# Patient Record
Sex: Female | Born: 1955 | Race: White | Hispanic: No | Marital: Married | Smoking: Current some day smoker
Health system: Southern US, Community
[De-identification: ages and names within clinical notes are randomized; demographics above are authoritative.]

## PROBLEM LIST (undated history)

## (undated) DIAGNOSIS — E785 Hyperlipidemia, unspecified: Secondary | ICD-10-CM

## (undated) DIAGNOSIS — C801 Malignant (primary) neoplasm, unspecified: Secondary | ICD-10-CM

## (undated) DIAGNOSIS — I471 Supraventricular tachycardia: Secondary | ICD-10-CM

## (undated) DIAGNOSIS — I10 Essential (primary) hypertension: Secondary | ICD-10-CM

## (undated) DIAGNOSIS — I2510 Atherosclerotic heart disease of native coronary artery without angina pectoris: Secondary | ICD-10-CM

## (undated) HISTORY — DX: Atherosclerotic heart disease of native coronary artery without angina pectoris: I25.10

## (undated) HISTORY — DX: Supraventricular tachycardia: I47.1

## (undated) HISTORY — DX: Hyperlipidemia, unspecified: E78.5

## (undated) HISTORY — PX: TONSILLECTOMY: SUR1361

## (undated) HISTORY — PX: TUBAL LIGATION: SHX77

---

## 2004-11-23 ENCOUNTER — Emergency Department: Payer: Self-pay | Admitting: Emergency Medicine

## 2011-07-31 ENCOUNTER — Emergency Department: Payer: Self-pay | Admitting: *Deleted

## 2011-07-31 LAB — BASIC METABOLIC PANEL
Calcium, Total: 8.9 mg/dL (ref 8.5–10.1)
Co2: 31 mmol/L (ref 21–32)
EGFR (African American): >60
EGFR (Non-African Amer.): >60
Glucose: 126 mg/dL — ABNORMAL HIGH (ref 65–99)
Osmolality: 275 (ref 275–301)
Sodium: 137 mmol/L (ref 136–145)

## 2011-07-31 LAB — CBC
HGB: 13.9 g/dL (ref 12.0–16.0)
MCH: 32.8 pg (ref 26.0–34.0)
Platelet: 136 10*3/uL — ABNORMAL LOW (ref 150–440)
RBC: 4.23 10*6/uL (ref 3.80–5.20)
RDW: 12.8 % (ref 11.5–14.5)
WBC: 8.6 10*3/uL (ref 3.6–11.0)

## 2011-07-31 LAB — URINALYSIS, COMPLETE
Bacteria: NONE SEEN
Blood: NEGATIVE
Ketone: NEGATIVE
Leukocyte Esterase: NEGATIVE
Nitrite: NEGATIVE
Ph: 7 (ref 4.5–8.0)
Protein: NEGATIVE
Specific Gravity: 1.023 (ref 1.003–1.030)
WBC UR: 2 /HPF (ref 0–5)

## 2011-08-01 LAB — HEPATIC FUNCTION PANEL A (ARMC)
Alkaline Phosphatase: 62 U/L (ref 50–136)
SGOT(AST): 32 U/L (ref 15–37)
SGPT (ALT): 20 U/L
Total Protein: 7.2 g/dL (ref 6.4–8.2)

## 2011-08-02 ENCOUNTER — Emergency Department: Payer: Self-pay | Admitting: Emergency Medicine

## 2011-08-02 LAB — CBC
HCT: 41.8 % (ref 35.0–47.0)
MCH: 32.9 pg (ref 26.0–34.0)
MCHC: 34.1 g/dL (ref 32.0–36.0)
MCV: 96 fL (ref 80–100)
Platelet: 137 10*3/uL — ABNORMAL LOW (ref 150–440)
RDW: 12.8 % (ref 11.5–14.5)

## 2011-08-02 LAB — COMPREHENSIVE METABOLIC PANEL
Albumin: 4.3 g/dL (ref 3.4–5.0)
Alkaline Phosphatase: 61 U/L (ref 50–136)
BUN: 15 mg/dL (ref 7–18)
Calcium, Total: 8.9 mg/dL (ref 8.5–10.1)
Creatinine: 0.89 mg/dL (ref 0.60–1.30)
Glucose: 104 mg/dL — ABNORMAL HIGH (ref 65–99)
Potassium: 3.6 mmol/L (ref 3.5–5.1)
SGOT(AST): 27 U/L (ref 15–37)
SGPT (ALT): 20 U/L
Total Protein: 7.9 g/dL (ref 6.4–8.2)

## 2011-08-02 LAB — URINALYSIS, COMPLETE
Bilirubin,UR: NEGATIVE
Blood: NEGATIVE
Leukocyte Esterase: NEGATIVE
Nitrite: NEGATIVE
Ph: 6 (ref 4.5–8.0)
Protein: NEGATIVE
RBC,UR: 2 /HPF (ref 0–5)
Specific Gravity: 1.019 (ref 1.003–1.030)
Squamous Epithelial: NONE SEEN

## 2011-08-02 LAB — URINE CULTURE

## 2011-12-02 ENCOUNTER — Ambulatory Visit: Payer: Self-pay | Admitting: Internal Medicine

## 2012-11-18 ENCOUNTER — Emergency Department: Payer: Self-pay | Admitting: Emergency Medicine

## 2012-11-18 LAB — TROPONIN I: Troponin-I: 0.67 ng/mL — ABNORMAL HIGH

## 2012-11-18 LAB — CBC
HCT: 41.4 % (ref 35.0–47.0)
HGB: 13.9 g/dL (ref 12.0–16.0)
MCV: 95 fL (ref 80–100)

## 2012-11-18 LAB — COMPREHENSIVE METABOLIC PANEL
Albumin: 3.5 g/dL (ref 3.4–5.0)
Alkaline Phosphatase: 118 U/L (ref 50–136)
Chloride: 103 mmol/L (ref 98–107)
Glucose: 110 mg/dL — ABNORMAL HIGH (ref 65–99)
Osmolality: 277 (ref 275–301)
SGOT(AST): 280 U/L — ABNORMAL HIGH (ref 15–37)
Total Protein: 6.6 g/dL (ref 6.4–8.2)

## 2012-11-18 LAB — CK TOTAL AND CKMB (NOT AT ARMC): CK-MB: 3.2 ng/mL (ref 0.5–3.6)

## 2012-12-25 DIAGNOSIS — Z0189 Encounter for other specified special examinations: Secondary | ICD-10-CM | POA: Insufficient documentation

## 2012-12-28 DIAGNOSIS — Z8249 Family history of ischemic heart disease and other diseases of the circulatory system: Secondary | ICD-10-CM | POA: Insufficient documentation

## 2012-12-28 DIAGNOSIS — R002 Palpitations: Secondary | ICD-10-CM | POA: Insufficient documentation

## 2012-12-28 DIAGNOSIS — G8929 Other chronic pain: Secondary | ICD-10-CM | POA: Insufficient documentation

## 2012-12-28 DIAGNOSIS — Z9119 Patient's noncompliance with other medical treatment and regimen: Secondary | ICD-10-CM | POA: Insufficient documentation

## 2013-06-17 HISTORY — PX: CARDIAC CATHETERIZATION: SHX172

## 2013-07-03 ENCOUNTER — Inpatient Hospital Stay: Payer: Self-pay | Admitting: Internal Medicine

## 2013-07-03 LAB — COMPREHENSIVE METABOLIC PANEL
ALBUMIN: 3.6 g/dL (ref 3.4–5.0)
ALK PHOS: 80 U/L
AST: 54 U/L — AB (ref 15–37)
Anion Gap: 5 — ABNORMAL LOW (ref 7–16)
BUN: 17 mg/dL (ref 7–18)
Bilirubin,Total: 0.3 mg/dL (ref 0.2–1.0)
CHLORIDE: 106 mmol/L (ref 98–107)
CO2: 28 mmol/L (ref 21–32)
Calcium, Total: 9 mg/dL (ref 8.5–10.1)
Creatinine: 1.1 mg/dL (ref 0.60–1.30)
EGFR (Non-African Amer.): 56 — ABNORMAL LOW
GLUCOSE: 88 mg/dL (ref 65–99)
Osmolality: 279 (ref 275–301)
POTASSIUM: 4.3 mmol/L (ref 3.5–5.1)
SGPT (ALT): 42 U/L (ref 12–78)
Sodium: 139 mmol/L (ref 136–145)
Total Protein: 6.8 g/dL (ref 6.4–8.2)

## 2013-07-03 LAB — CK TOTAL AND CKMB (NOT AT ARMC)
CK, Total: 85 U/L (ref 21–215)
CK-MB: 2.4 ng/mL (ref 0.5–3.6)

## 2013-07-03 LAB — CBC
HCT: 40.5 % (ref 35.0–47.0)
HGB: 13.7 g/dL (ref 12.0–16.0)
MCH: 31.7 pg (ref 26.0–34.0)
MCHC: 33.9 g/dL (ref 32.0–36.0)
MCV: 94 fL (ref 80–100)
Platelet: 176 10*3/uL (ref 150–440)
RBC: 4.33 10*6/uL (ref 3.80–5.20)
RDW: 12.8 % (ref 11.5–14.5)
WBC: 9.6 10*3/uL (ref 3.6–11.0)

## 2013-07-03 LAB — TROPONIN I: TROPONIN-I: 0.17 ng/mL — AB

## 2013-07-04 DIAGNOSIS — I214 Non-ST elevation (NSTEMI) myocardial infarction: Secondary | ICD-10-CM

## 2013-07-04 DIAGNOSIS — I471 Supraventricular tachycardia: Secondary | ICD-10-CM

## 2013-07-04 LAB — CK TOTAL AND CKMB (NOT AT ARMC)
CK, TOTAL: 79 U/L (ref 21–215)
CK, Total: 80 U/L (ref 21–215)
CK-MB: 5.5 ng/mL — ABNORMAL HIGH (ref 0.5–3.6)
CK-MB: 5.3 ng/mL — AB (ref 0.5–3.6)

## 2013-07-04 LAB — CBC WITH DIFFERENTIAL/PLATELET
BASOS ABS: 0.1 10*3/uL (ref 0.0–0.1)
BASOS PCT: 0.8 %
EOS PCT: 1.3 %
Eosinophil #: 0.1 10*3/uL (ref 0.0–0.7)
HCT: 37.4 % (ref 35.0–47.0)
HGB: 12.7 g/dL (ref 12.0–16.0)
LYMPHS ABS: 2.3 10*3/uL (ref 1.0–3.6)
LYMPHS PCT: 32.6 %
MCH: 31.6 pg (ref 26.0–34.0)
MCHC: 33.8 g/dL (ref 32.0–36.0)
MCV: 93 fL (ref 80–100)
MONOS PCT: 8.8 %
Monocyte #: 0.6 x10 3/mm (ref 0.2–0.9)
Neutrophil #: 4 10*3/uL (ref 1.4–6.5)
Neutrophil %: 56.5 %
Platelet: 164 10*3/uL (ref 150–440)
RBC: 4.01 10*6/uL (ref 3.80–5.20)
RDW: 13 % (ref 11.5–14.5)
WBC: 7 10*3/uL (ref 3.6–11.0)

## 2013-07-04 LAB — TSH: Thyroid Stimulating Horm: 2.64 u[IU]/mL

## 2013-07-04 LAB — BASIC METABOLIC PANEL
ANION GAP: 5 — AB (ref 7–16)
BUN: 14 mg/dL (ref 7–18)
CALCIUM: 8.6 mg/dL (ref 8.5–10.1)
CHLORIDE: 109 mmol/L — AB (ref 98–107)
CO2: 25 mmol/L (ref 21–32)
Creatinine: 0.87 mg/dL (ref 0.60–1.30)
EGFR (Non-African Amer.): >60
Glucose: 101 mg/dL — ABNORMAL HIGH (ref 65–99)
OSMOLALITY: 278 (ref 275–301)
Potassium: 3.8 mmol/L (ref 3.5–5.1)
SODIUM: 139 mmol/L (ref 136–145)

## 2013-07-04 LAB — TROPONIN I
Troponin-I: 0.91 ng/mL — ABNORMAL HIGH
Troponin-I: 0.81 ng/mL — ABNORMAL HIGH
Troponin-I: 0.66 ng/mL — ABNORMAL HIGH

## 2013-07-04 LAB — CK-MB: CK-MB: 4.8 ng/mL — ABNORMAL HIGH (ref 0.5–3.6)

## 2013-07-05 ENCOUNTER — Telehealth: Payer: Self-pay

## 2013-07-05 DIAGNOSIS — I2510 Atherosclerotic heart disease of native coronary artery without angina pectoris: Secondary | ICD-10-CM

## 2013-07-05 LAB — PROTIME-INR
INR: 1
Prothrombin Time: 12.9 secs (ref 11.5–14.7)

## 2013-07-05 NOTE — Telephone Encounter (Signed)
Patient contacted regarding discharge from [facility] on 07/05/13.  Patient understands to follow up with Dr. Fletcher Anon on 07/19/13 at 2:00 at [facility]. Patient understands discharge instructions? yes Patient understands medications and regiment? yes Patient understands to bring all medications to this visit? yes

## 2013-07-06 ENCOUNTER — Encounter: Payer: Self-pay | Admitting: Cardiovascular Disease

## 2013-07-08 ENCOUNTER — Telehealth: Payer: Self-pay

## 2013-07-08 NOTE — Telephone Encounter (Signed)
Request from [place] , sent to [place] on 07/08/2013.

## 2013-07-14 ENCOUNTER — Encounter: Payer: Self-pay | Admitting: *Deleted

## 2013-07-19 ENCOUNTER — Ambulatory Visit (INDEPENDENT_AMBULATORY_CARE_PROVIDER_SITE_OTHER): Payer: 59 | Admitting: Cardiovascular Disease

## 2013-07-19 ENCOUNTER — Encounter: Payer: Self-pay | Admitting: Cardiovascular Disease

## 2013-07-19 VITALS — BP 100/62 | HR 66 | Ht 65.0 in | Wt 171.0 lb

## 2013-07-19 DIAGNOSIS — I471 Supraventricular tachycardia: Secondary | ICD-10-CM

## 2013-07-19 DIAGNOSIS — I2510 Atherosclerotic heart disease of native coronary artery without angina pectoris: Secondary | ICD-10-CM | POA: Insufficient documentation

## 2013-07-19 DIAGNOSIS — E785 Hyperlipidemia, unspecified: Secondary | ICD-10-CM | POA: Insufficient documentation

## 2013-07-19 MED ORDER — DILTIAZEM HCL ER 240 MG PO CP24: 240.0000 mg | ORAL_CAPSULE | Freq: Every day | ORAL | Status: DC

## 2013-07-19 NOTE — Patient Instructions (Addendum)
Refer to Dr. Caryl Comes for SVT.   You can resume work without restrictions.   Stop Lisinopril. Continue other medications.   Your physician wants you to follow-up in: 6 months.  You will receive a reminder letter in the mail two months in advance. If you don't receive a letter, please call our office to schedule the follow-up appointment.

## 2013-07-19 NOTE — Assessment & Plan Note (Signed)
Continue small dose atorvastatin. I recommend not to increase the dose given that she is on a calcium channel blocker.

## 2013-07-19 NOTE — Progress Notes (Signed)
Primary care physician: Dr. Eliberto Ivory  HPI  This is a pleasant 58 year old female who is here today for a followup visit after recent hospitalization at [facility] for supraventricular tachycardia. She has prolonged history of paroxysmal supraventricular tachycardia and had 5 episodes in the last few years which required either emergency room visits or hospitalizations. She has been treated with metoprolol 50 mg twice daily and was being followed by Dr. Saralyn Pilar. She requested transfer to our practice during her recent hospitalization. She presented on January 17 with palpitations and tachycardia. This was associated with prolonged substernal chest tightness. She converted to normal sinus rhythm in route to the ED. She was found to have an elevated troponin at 0.91. 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 I proceeded with cardiac catheterization which showed mild nonobstructive three-vessel coronary artery disease. There was significant spasm noted in the mid LAD which resolved with intracoronary nitroglycerin. Ejection fraction was normal. I switched her from metoprolol to diltiazem. She has been doing reasonably well since hospital discharge with no recurrent tachycardia. She does feel pounding sensation in her chest.  Allergies  Allergen Reactions  . Codeine     Other reaction(s): Pruritic rash  . Penicillin G     Other reaction(s): Pruritic rash  . Sulfa Antibiotics     Other reaction(s): Pruritic rash     Current Outpatient Prescriptions on File Prior to Visit  Medication Sig Dispense Refill  . albuterol (PROVENTIL HFA;VENTOLIN HFA) 108 (90 BASE) MCG/ACT inhaler Inhale 2 puffs into the lungs every 4 (four) hours as needed for wheezing or shortness of breath.      Marland Kitchen aspirin 81 MG tablet Take 81 mg by mouth daily.      Marland Kitchen atorvastatin (LIPITOR) 10 MG tablet Take 10 mg by mouth daily.       No current  facility-administered medications on file prior to visit.     Past Medical History  Diagnosis Date  . PSVT (paroxysmal supraventricular tachycardia)   . Coronary artery disease     Cardiac catheterization in January of 2015 showed mild nonobstructive three-vessel coronary artery disease with significant mid LAD spasm which improved with nitroglycerin. Metoprolol was switched to diltiazem  . Hyperlipidemia      Past Surgical History  Procedure Laterality Date  . Tonsillectomy    . Tubal ligation    . Cardiac catheterization  06/2013    [facility]     Family History  Problem Relation Age of Onset  . Stroke Mother   . Heart attack Father      History   Social History  . Marital Status: Divorced    Spouse Name: N/A    Number of Children: N/A  . Years of Education: N/A   Occupational History  . Not on file.   Social History Main Topics  . Smoking status: Former Smoker -- 30 years    Types: Cigarettes  . Smokeless tobacco: Not on file  . Alcohol Use: No  . Drug Use: No  . Sexual Activity: Not on file   Other Topics Concern  . Not on file   Social History Narrative  . No narrative on file     ROS A 10 point review of system was performed. It is negative other than that mentioned in the history of present illness.   PHYSICAL EXAM   BP 100/62  Pulse 66  Ht 5\' 5"  (1.651 m)  Wt 171 lb (77.565 kg)  BMI 28.46 kg/m2 Constitutional: She is oriented to person, place, and time. She appears well-developed and well-nourished. No distress.  HENT: No nasal discharge.  Head: Normocephalic and atraumatic.  Eyes: Pupils are equal and round. No discharge.  Neck: Normal range of motion. Neck supple. No JVD present. No thyromegaly present.  Cardiovascular: Normal rate, regular rhythm, normal heart sounds. Exam reveals no gallop and no friction rub. No murmur heard.  Pulmonary/Chest: Effort normal and breath sounds normal. No stridor. No respiratory distress. She has no  wheezes. She has no rales. She exhibits no tenderness.  Abdominal: Soft. Bowel sounds are normal. She exhibits no distension. There is no tenderness. There is no rebound and no guarding.  Musculoskeletal: Normal range of motion. She exhibits no edema and no tenderness.  Neurological: She is alert and oriented to person, place, and time. Coordination normal.  Skin: Skin is warm and dry. No rash noted. She is not diaphoretic. No erythema. No pallor.  Psychiatric: She has a normal mood and affect. Her behavior is normal. Judgment and thought content normal.  Right radial pulse is normal with no hematoma   EKG: Normal sinus rhythm with sinus arrhythmia   ASSESSMENT AND PLAN

## 2013-07-19 NOTE — Assessment & Plan Note (Signed)
The patient has known history of recurrent supraventricular tachycardia in spite of treatment with metoprolol. 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 She was recently switched from metoprolol to diltiazem. However, I am still concerned about the possibility of recurrent events. I will refer her to Dr. Caryl Comes to consider catheter ablation. She can resume work from a cardiac standpoint. Given that her blood pressure is somewhat on the low side, I stopped lisinopril today. Continue current dose of diltiazem

## 2013-07-19 NOTE — Assessment & Plan Note (Signed)
Continue small dose aspirin and treatment with atorvastatin. She has no symptoms suggestive of angina.

## 2013-07-21 ENCOUNTER — Telehealth: Payer: Self-pay | Admitting: *Deleted

## 2013-07-21 ENCOUNTER — Other Ambulatory Visit: Payer: Self-pay | Admitting: *Deleted

## 2013-07-21 MED ORDER — LISINOPRIL 5 MG PO TABS: 5.0000 mg | ORAL_TABLET | Freq: Every day | ORAL | Status: DC

## 2013-07-21 NOTE — Telephone Encounter (Signed)
Informed patient that per Dr. Fletcher Anon she can restart her Lisinopril 5 mg daily. Instructed patient to monitor blood pressure and bring log to her next visit. Patient verbalized understanding.

## 2013-07-21 NOTE — Telephone Encounter (Signed)
She feels like she needs to go back on her bp medicine (lisinopril 5mg ). Patient bp is going back up with a bad headache.

## 2013-07-21 NOTE — Telephone Encounter (Signed)
That is fine. Resume Lisinopril.

## 2013-08-02 ENCOUNTER — Other Ambulatory Visit: Payer: Self-pay | Admitting: *Deleted

## 2013-08-02 ENCOUNTER — Telehealth: Payer: Self-pay | Admitting: *Deleted

## 2013-08-02 MED ORDER — LISINOPRIL 5 MG PO TABS: 5.0000 mg | ORAL_TABLET | Freq: Every day | ORAL | Status: DC

## 2013-08-02 NOTE — Telephone Encounter (Signed)
Health port info faxed 08/02/13

## 2013-08-02 NOTE — Telephone Encounter (Signed)
Requested Prescriptions   Signed Prescriptions Disp Refills  . lisinopril (PRINIVIL,ZESTRIL) 5 MG tablet 30 tablet 3    Sig: Take 1 tablet (5 mg total) by mouth daily.    Authorizing Provider: Kathlyn Sacramento A    Ordering User: Britt Bottom

## 2013-08-03 DIAGNOSIS — I2510 Atherosclerotic heart disease of native coronary artery without angina pectoris: Secondary | ICD-10-CM

## 2013-08-03 DIAGNOSIS — E785 Hyperlipidemia, unspecified: Secondary | ICD-10-CM

## 2013-08-04 ENCOUNTER — Ambulatory Visit: Payer: 59 | Admitting: Internal Medicine

## 2013-08-06 ENCOUNTER — Other Ambulatory Visit: Payer: Self-pay

## 2013-08-06 MED ORDER — LISINOPRIL 5 MG PO TABS: 5.0000 mg | ORAL_TABLET | Freq: Every day | ORAL | Status: DC

## 2013-08-06 MED ORDER — ATORVASTATIN CALCIUM 10 MG PO TABS: 10.0000 mg | ORAL_TABLET | Freq: Every day | ORAL | Status: DC

## 2013-08-09 ENCOUNTER — Other Ambulatory Visit: Payer: Self-pay

## 2013-08-09 MED ORDER — LISINOPRIL 5 MG PO TABS: 5.0000 mg | ORAL_TABLET | Freq: Every day | ORAL | Status: DC

## 2013-09-03 ENCOUNTER — Telehealth: Payer: Self-pay | Admitting: *Deleted

## 2013-09-03 NOTE — Telephone Encounter (Signed)
Faxed disability forms to [employer]

## 2013-09-03 NOTE — Telephone Encounter (Signed)
Sent Disability forms to health port [place] mail

## 2013-09-07 ENCOUNTER — Ambulatory Visit (INDEPENDENT_AMBULATORY_CARE_PROVIDER_SITE_OTHER): Payer: 59 | Admitting: Internal Medicine

## 2013-09-07 ENCOUNTER — Encounter: Payer: Self-pay | Admitting: Internal Medicine

## 2013-09-07 VITALS — BP 125/84 | HR 86 | Ht 65.0 in | Wt 172.5 lb

## 2013-09-07 DIAGNOSIS — I471 Supraventricular tachycardia: Secondary | ICD-10-CM

## 2013-09-07 NOTE — Patient Instructions (Signed)
Please see Dr. Crissie Sickles in [place] in 3 months

## 2013-09-07 NOTE — Progress Notes (Signed)
ELECTROPHYSIOLOGY CONSULT NOTE  Patient ID: Latasha [name], MRN: [id number], DOB/AGE: Nov 15, 1955 58 y.o. Admit date: (Not on file) Date of Consult: 09/07/2013  Primary Physician: Estanislado Spire, MD Primary Cardiologist: MA   Chief Complaint: SVT   HPI Latasha [name] is a 58 y.o. female  with 40 year history of recurrent abrupt onset offset tachypalpitations. They are frog positive and diuretic negative. They're frequently triggered by bending over. They're associated with presyncope but no syncope and most recently with chest discomfort. This prompted catheterization which demonstrated no significant obstructive disease. She has normal left ventricular function and a previously normal echo.  These episodes have been treated in the past with beta blockers and more recently treated with calcium blocker. There is some concern about spasm at the catheterization.  She has stopped smoking.   She has mild dyspnea on exertion but no edema. Hshe has had no syncope    Past Medical History  Diagnosis Date  . PSVT (paroxysmal supraventricular tachycardia)   . Coronary artery disease     Cardiac catheterization in January of 2015 showed mild nonobstructive three-vessel coronary artery disease with significant mid LAD spasm which improved with nitroglycerin. Metoprolol was switched to diltiazem  . Hyperlipidemia       Surgical History:  Past Surgical History  Procedure Laterality Date  . Tonsillectomy    . Tubal ligation    . Cardiac catheterization  06/2013    [facility]     Home Meds: Prior to Admission medications   Medication Sig Start Date End Date Taking? Authorizing Provider  acetaminophen (TYLENOL) 325 MG tablet Take 650 mg by mouth every 6 (six) hours as needed.   Yes Historical Provider, MD  albuterol (PROVENTIL HFA;VENTOLIN HFA) 108 (90 BASE) MCG/ACT inhaler Inhale 2 puffs into the lungs every 4 (four) hours as needed for wheezing or shortness of breath.   Yes Historical Provider, MD    aspirin 81 MG tablet Take 81 mg by mouth daily.   Yes Historical Provider, MD  atorvastatin (LIPITOR) 10 MG tablet Take 1 tablet (10 mg total) by mouth daily. 08/06/13  Yes Minna Merritts, MD  cetirizine (ZYRTEC) 10 MG tablet Take 10 mg by mouth daily.   Yes Historical Provider, MD  diltiazem (DILACOR XR) 240 MG 24 hr capsule Take 1 capsule (240 mg total) by mouth daily. 07/19/13  Yes Wellington Hampshire, MD  lisinopril (PRINIVIL,ZESTRIL) 5 MG tablet Take 1 tablet (5 mg total) by mouth daily. 08/09/13  Yes Wellington Hampshire, MD      Allergies  Allergen Reactions  . Codeine     Other reaction(s): Pruritic rash  . Penicillin G     Other reaction(s): Pruritic rash  . Sulfa Antibiotics     Other reaction(s): Pruritic rash    History   Social History  . Marital Status: Divorced    Spouse Name: N/A    Number of Children: N/A  . Years of Education: N/A   Occupational History  . Not on file.   Social History Main Topics  . Smoking status: Former Smoker -- 30 years    Types: Cigarettes  . Smokeless tobacco: Not on file  . Alcohol Use: No  . Drug Use: No  . Sexual Activity: Not on file   Other Topics Concern  . Not on file   Social History Narrative  . No narrative on file     Family History  Problem Relation Age of Onset  . Stroke Mother   .  Heart attack Father      ROS:  Please see the history of present illness.     All other systems reviewed and negative.    Physical Exam: Blood pressure 125/84, pulse 86, height 5\' 5"  (1.651 m), weight 172 lb 8 oz (78.245 kg). General: Well developed, well nourished female in no acute distress. Head: Normocephalic, atraumatic, sclera non-icteric, no xanthomas, nares are without discharge. EENT: normal Lymph Nodes:  none Back: without scoliosis/kyphosis, no CVA tendersness Neck: Negative for carotid bruits. JVD not elevated. Lungs: Clear bilaterally to auscultation without wheezes, rales, or rhonchi. Breathing is unlabored. Heart:  RRR with S1 S2. No  murmur , rubs, or gallops appreciated. Abdomen: Soft, non-tender, non-distended with normoactive bowel sounds. No hepatomegaly. No rebound/guarding. No obvious abdominal masses. Msk:  Strength and tone appear normal for age. Extremities: No clubbing or cyanosis. No  edema.  Distal pedal pulses are 2+ and equal bilaterally. Skin: Warm and Dry Neuro: Alert and oriented X 3. CN III-XII intact Grossly normal sensory and motor function . Psych:  Responds to questions appropriately with a normal affect.      Labs: Cardiac Enzymes No results found for this basename: CKTOTAL, CKMB, TROPONINI,  in the last 72 hours CBC No results found for this basename: WBC, HGB, HCT, MCV, PLT   PROTIME: No results found for this basename: LABPROT, INR,  in the last 72 hours Chemistry No results found for this basename: NA, K, CL, CO2, BUN, CREATININE, CALCIUM, LABALBU, PROT, BILITOT, ALKPHOS, ALT, AST, GLUCOSE,  in the last 168 hours Lipids No results found for this basename: CHOL, HDL, LDLCALC, TRIG   BNP No results found for this basename: probnp   Miscellaneous No results found for this basename: DDIMER    Radiology/Studies:  No results found.  EKG:  Sinus rhythm at 86 Intervals 15/09/38 No preexcitation  ECG dated 4 June 14 demonstrates narrow QRS tachycardia at a cycle length of 320 ms with an R. Prime in lead V1 way that it was sudden there is no   Assessment and Plan:   Supraventricular tachycardia  Hypertension  The patient has recurrent  SVT that has broken through beta blockers. She is taking calcium blockers. She would like to think about catheter ablation and we discussed at length including potential benefits as well as risks including but not limited to catastrophic outcomes of less than 1000 and heart block requiring pacemaker implantation in the range of 1/100-300   she has some financial issues that she would like to square off the firstand so we'll arrange  for her to see Dr. Elliot Cousin in [place] in about 3 or 4 months to pursue the procedure.  Virl Axe

## 2013-11-16 ENCOUNTER — Institutional Professional Consult (permissible substitution): Payer: 59 | Admitting: Internal Medicine

## 2013-12-01 ENCOUNTER — Other Ambulatory Visit: Payer: Self-pay | Admitting: Cardiovascular Disease

## 2013-12-14 ENCOUNTER — Institutional Professional Consult (permissible substitution): Payer: 59 | Admitting: Internal Medicine

## 2014-01-06 ENCOUNTER — Institutional Professional Consult (permissible substitution): Payer: 59 | Admitting: Internal Medicine

## 2014-02-15 ENCOUNTER — Institutional Professional Consult (permissible substitution): Payer: 59 | Admitting: Internal Medicine

## 2014-03-22 ENCOUNTER — Encounter: Payer: Self-pay | Admitting: Internal Medicine

## 2014-03-22 ENCOUNTER — Ambulatory Visit (INDEPENDENT_AMBULATORY_CARE_PROVIDER_SITE_OTHER): Payer: 59 | Admitting: Internal Medicine

## 2014-03-22 ENCOUNTER — Encounter: Payer: Self-pay | Admitting: *Deleted

## 2014-03-22 VITALS — BP 142/86 | HR 91 | Ht 63.0 in | Wt 164.0 lb

## 2014-03-22 DIAGNOSIS — I471 Supraventricular tachycardia: Secondary | ICD-10-CM

## 2014-03-22 NOTE — Patient Instructions (Addendum)
Your physician has recommended that you have an ablation. Catheter ablation is a medical procedure used to treat some cardiac arrhythmias (irregular heartbeats). During catheter ablation, a long, thin, flexible tube is put into a blood vessel in your groin (upper thigh), or neck. This tube is called an ablation catheter. It is then guided to your heart through the blood vessel. Radio frequency waves destroy small areas of heart tissue where abnormal heartbeats may cause an arrhythmia to start. Please see the instruction sheet given to you today.  Your physician recommends that you continue on your current medications as directed. Please refer to the Current Medication list given to you today. Cardiac Ablation Cardiac ablation is a procedure to disable a small amount of heart tissue in very specific places. The heart has many electrical connections. Sometimes these connections are abnormal and can cause the heart to beat very fast or irregularly. By disabling some of the problem areas, heart rhythm can be improved or made normal. Ablation is done for people who:   Have Wolff-Parkinson-White syndrome.   Have other fast heart rhythms (tachycardia).   Have taken medicines for an abnormal heart rhythm (arrhythmia) that resulted in:   No success.   Side effects.   May have a high-risk heartbeat that could result in death.  LET [facility] CARE PROVIDER KNOW ABOUT:   Any allergies you have or any previous reactions you have had to X-ray dye, food (such as seafood), medicine, or tape.   All medicines you are taking, including vitamins, herbs, eye drops, creams, and over-the-counter medicines.   Previous problems you or members of your family have had with the use of anesthetics.   Any blood disorders you have.   Previous surgeries or procedures (such as a kidney transplant) you have had.   Medical conditions you have (such as kidney failure).  RISKS AND COMPLICATIONS Generally,  cardiac ablation is a safe procedure. However, problems can occur and include:   Increased risk of cancer. Depending on how long it takes to do the ablation, the dose of radiation can be high.  Bruising and bleeding where a thin, flexible tube (catheter) was inserted during the procedure.   Bleeding into the chest, especially into the sac that surrounds the heart (serious).  Need for a permanent pacemaker if the normal electrical system is damaged.   The procedure may not be fully effective, and this may not be recognized for months. Repeat ablation procedures are sometimes required. BEFORE THE PROCEDURE   Follow any instructions from your health care provider regarding eating and drinking before the procedure.   Take your medicines as directed at regular times with water, unless instructed otherwise by your health care provider. If you are taking diabetes medicine, including insulin, ask how you are to take it and if there are any special instructions you should follow. It is common to adjust insulin dosing the day of the ablation.  PROCEDURE  An ablation is usually performed in a catheterization laboratory with the guidance of fluoroscopy. Fluoroscopy is a type of X-ray that helps your health care provider see images of your heart during the procedure.   An ablation is a minimally invasive procedure. This means a small cut (incision) is made in either your neck or groin. Your health care provider will decide where to make the incision based on your medical history and physical exam.  An IV tube will be started before the procedure begins. You will be given an anesthetic or medicine to  help you relax (sedative).  The skin on your neck or groin will be numbed. A needle will be inserted into a large vein in your neck or groin and catheters will be threaded to your heart.  A special dye that shows up on fluoroscopy pictures may be injected through the catheter. The dye helps your health  care provider see the area of the heart that needs treatment.  The catheter has electrodes on the tip. When the area of heart tissue that is causing the arrhythmia is found, the catheter tip will send an electrical current to the area and "scar" the tissue. Three types of energy can be used to ablate the heart tissue:   Heat (radiofrequency energy).   Laser energy.   Extreme cold (cryoablation).   When the area of the heart has been ablated, the catheter will be taken out. Pressure will be held on the insertion site. This will help the insertion site clot and keep it from bleeding. A bandage will be placed on the insertion site.  AFTER THE PROCEDURE   After the procedure, you will be taken to a recovery area where your vital signs (blood pressure, heart rate, and breathing) will be monitored. The insertion site will also be monitored for bleeding.   You will need to lie still for 4-6 hours. This is to ensure you do not bleed from the catheter insertion site.  Document Released: 10/20/2008 Document Revised: 10/18/2013 Document Reviewed: 10/26/2012 [facility] Patient Information 2015 [place], [place]. This information is not intended to replace advice given to you by your health care provider. Make sure you discuss any questions you have with your health care provider.

## 2014-03-22 NOTE — Progress Notes (Signed)
HPI Latasha [name] is referred today by Dr. Caryl Comes for consideration of catheter ablation of SVT. The patient is a 58 year old woman with a long-standing history of tachycardia palpitations dating back to age 53. At that time, she had documented SVT at 200 beats per minute. Since then she has had recurrent episodes which have worsened in the last year. The episodes start and stop suddenly, and can be relieved by vagal maneuvers or Valsalva. She has not required adenosine. She has been treated with calcium channel blockers with moderate success. However she feels tired and fatigued, and would like to get off of these medications. Allergies  Allergen Reactions  . Codeine Nausea And Vomiting    Other reaction(s): Pruritic rash  . Penicillin G     Other reaction(s): Pruritic rash  . Sulfa Antibiotics Other (See Comments)    Other reaction(s): Pruritic rash  . Penicillins Rash     Current Outpatient Prescriptions  Medication Sig Dispense Refill  . acetaminophen (TYLENOL) 325 MG tablet Take 650 mg by mouth every 6 (six) hours as needed.      Marland Kitchen albuterol (PROVENTIL HFA;VENTOLIN HFA) 108 (90 BASE) MCG/ACT inhaler Inhale 2 puffs into the lungs every 4 (four) hours as needed for wheezing or shortness of breath.      Marland Kitchen aspirin 81 MG tablet Take 81 mg by mouth daily.      . cetirizine (ZYRTEC) 10 MG tablet Take 10 mg by mouth daily.      Marland Kitchen diltiazem (DILACOR XR) 240 MG 24 hr capsule Take 1 capsule (240 mg total) by mouth daily.  30 capsule  6  . lisinopril (PRINIVIL,ZESTRIL) 5 MG tablet Take 1 tablet (5 mg total) by mouth daily.  30 tablet  6   No current facility-administered medications for this visit.     Past Medical History  Diagnosis Date  . PSVT (paroxysmal supraventricular tachycardia)   . Coronary artery disease     Cardiac catheterization in January of 2015 showed mild nonobstructive three-vessel coronary artery disease with significant mid LAD spasm which improved with  nitroglycerin. Metoprolol was switched to diltiazem  . Hyperlipidemia     ROS:   All systems reviewed and negative except as noted in the HPI.   Past Surgical History  Procedure Laterality Date  . Tonsillectomy    . Tubal ligation    . Cardiac catheterization  06/2013    [facility]     Family History  Problem Relation Age of Onset  . Stroke Mother   . Heart attack Father      History   Social History  . Marital Status: Divorced    Spouse Name: N/A    Number of Children: N/A  . Years of Education: N/A   Occupational History  . Not on file.   Social History Main Topics  . Smoking status: Current Some Day Smoker -- 30 years    Types: Cigarettes  . Smokeless tobacco: Not on file  . Alcohol Use: No  . Drug Use: No  . Sexual Activity: Not on file   Other Topics Concern  . Not on file   Social History Narrative  . No narrative on file     BP 142/86  Pulse 91  Ht 5\' 3"  (1.6 m)  Wt 164 lb (74.39 kg)  BMI 29.06 kg/m2  Physical Exam:  Well appearing 58 year old woman, NAD HEENT: Unremarkable Neck:  No JVD, no thyromegally Back:  No CVA tenderness Lungs:  Clear with no  wheezes, rales, or rhonchi. HEART:  Regular rate rhythm, no murmurs, no rubs, no clicks Abd:  soft, positive bowel sounds, no organomegally, no rebound, no guarding Ext:  2 plus pulses, no edema, no cyanosis, no clubbing Skin:  No rashes no nodules Neuro:  CN II through XII intact, motor grossly intact  EKG - normal sinus rhythm with normal axis and intervals.   Assess/Plan:

## 2014-03-22 NOTE — Assessment & Plan Note (Signed)
I've discussed the treatment options in detail with Latasha [name]. The risks, goals, benefits, and expectations of the procedure of catheter ablation have been discussed and she would like to proceed with catheter ablation.

## 2014-03-24 ENCOUNTER — Other Ambulatory Visit: Payer: Self-pay | Admitting: *Deleted

## 2014-04-06 ENCOUNTER — Encounter (HOSPITAL_COMMUNITY): Payer: Self-pay | Admitting: Pharmacy Technician

## 2014-04-18 ENCOUNTER — Other Ambulatory Visit (INDEPENDENT_AMBULATORY_CARE_PROVIDER_SITE_OTHER): Payer: 59 | Admitting: *Deleted

## 2014-04-18 DIAGNOSIS — I471 Supraventricular tachycardia: Secondary | ICD-10-CM

## 2014-04-18 LAB — CBC WITH DIFFERENTIAL/PLATELET
BASOS PCT: 0.8 % (ref 0.0–3.0)
Basophils Absolute: 0.1 10*3/uL (ref 0.0–0.1)
EOS PCT: 1.3 % (ref 0.0–5.0)
Eosinophils Absolute: 0.1 10*3/uL (ref 0.0–0.7)
HCT: 38.6 % (ref 36.0–46.0)
Hemoglobin: 12.6 g/dL (ref 12.0–15.0)
LYMPHS PCT: 32.3 % (ref 12.0–46.0)
Lymphs Abs: 2.7 10*3/uL (ref 0.7–4.0)
MCHC: 32.8 g/dL (ref 30.0–36.0)
MCV: 93.7 fl (ref 78.0–100.0)
MONOS PCT: 7.5 % (ref 3.0–12.0)
Monocytes Absolute: 0.6 10*3/uL (ref 0.1–1.0)
Neutro Abs: 4.8 10*3/uL (ref 1.4–7.7)
Neutrophils Relative %: 58.1 % (ref 43.0–77.0)
PLATELETS: 221 10*3/uL (ref 150.0–400.0)
RBC: 4.12 Mil/uL (ref 3.87–5.11)
RDW: 12.9 % (ref 11.5–15.5)
WBC: 8.3 10*3/uL (ref 4.0–10.5)

## 2014-04-18 LAB — BASIC METABOLIC PANEL
BUN: 11 mg/dL (ref 6–23)
CALCIUM: 9.3 mg/dL (ref 8.4–10.5)
CHLORIDE: 100 meq/L (ref 96–112)
CO2: 25 mEq/L (ref 19–32)
CREATININE: 0.9 mg/dL (ref 0.4–1.2)
GFR: 68.29 mL/min (ref >60.00)
Glucose, Bld: 90 mg/dL (ref 70–99)
Potassium: 4 mEq/L (ref 3.5–5.1)
Sodium: 134 mEq/L — ABNORMAL LOW (ref 135–145)

## 2014-04-18 LAB — PROTIME-INR
INR: 1 ratio (ref 0.8–1.0)
Prothrombin Time: 11.3 s (ref 9.6–13.1)

## 2014-04-22 ENCOUNTER — Encounter (HOSPITAL_COMMUNITY)
Admission: RE | Disposition: A | Discharge status: Home / Self Care | Payer: Self-pay | Source: Ambulatory Visit | Attending: Internal Medicine

## 2014-04-22 ENCOUNTER — Ambulatory Visit (HOSPITAL_COMMUNITY)
Admission: RE | Admit: 2014-04-22 | Discharge: 2014-04-22 | Disposition: A | Discharge status: Home / Self Care | Payer: 59 | Source: Ambulatory Visit | Attending: Internal Medicine | Admitting: Internal Medicine

## 2014-04-22 DIAGNOSIS — Z79899 Other long term (current) drug therapy: Secondary | ICD-10-CM | POA: Insufficient documentation

## 2014-04-22 DIAGNOSIS — I2510 Atherosclerotic heart disease of native coronary artery without angina pectoris: Secondary | ICD-10-CM | POA: Diagnosis not present

## 2014-04-22 DIAGNOSIS — I471 Supraventricular tachycardia, unspecified: Secondary | ICD-10-CM | POA: Diagnosis present

## 2014-04-22 DIAGNOSIS — F17210 Nicotine dependence, cigarettes, uncomplicated: Secondary | ICD-10-CM | POA: Insufficient documentation

## 2014-04-22 DIAGNOSIS — Z7982 Long term (current) use of aspirin: Secondary | ICD-10-CM | POA: Diagnosis not present

## 2014-04-22 DIAGNOSIS — E785 Hyperlipidemia, unspecified: Secondary | ICD-10-CM | POA: Diagnosis present

## 2014-04-22 HISTORY — PX: SUPRAVENTRICULAR TACHYCARDIA ABLATION: SHX5492

## 2014-04-22 SURGERY — SUPRAVENTRICULAR TACHYCARDIA ABLATION
Anesthesia: LOCAL

## 2014-04-22 MED ORDER — ASPIRIN EC 81 MG PO TBEC: 81.0000 mg | DELAYED_RELEASE_TABLET | Freq: Every day | ORAL | Status: DC

## 2014-04-22 MED ORDER — FENTANYL CITRATE 0.05 MG/ML IJ SOLN
INTRAMUSCULAR | Status: AC
  Filled 2014-04-22: qty 2

## 2014-04-22 MED ORDER — ISOPROTERENOL HCL 0.2 MG/ML IJ SOLN
2.0000 ug/min | INTRAVENOUS | Status: DC
  Filled 2014-04-22 (×2): qty 5

## 2014-04-22 MED ORDER — SODIUM CHLORIDE 0.9 % IJ SOLN
3.0000 mL | Freq: Two times a day (BID) | INTRAMUSCULAR | Status: DC
  Administered 2014-04-22: 3 mL via INTRAVENOUS

## 2014-04-22 MED ORDER — HEPARIN (PORCINE) IN NACL 2-0.9 UNIT/ML-% IJ SOLN
INTRAMUSCULAR | Status: AC
  Filled 2014-04-22: qty 500

## 2014-04-22 MED ORDER — MIDAZOLAM HCL 5 MG/5ML IJ SOLN
INTRAMUSCULAR | Status: AC
Start: 2014-04-22 — End: 2014-04-22
  Filled 2014-04-22: qty 5

## 2014-04-22 MED ORDER — BUPIVACAINE HCL (PF) 0.25 % IJ SOLN
INTRAMUSCULAR | Status: AC
Start: 2014-04-22 — End: 2014-04-22
  Filled 2014-04-22: qty 30

## 2014-04-22 MED ORDER — ACETAMINOPHEN 325 MG PO TABS
650.0000 mg | ORAL_TABLET | ORAL | Status: DC | PRN
Start: 2014-04-22 — End: 2014-04-22

## 2014-04-22 MED ORDER — SODIUM CHLORIDE 0.9 % IV SOLN: 250.0000 mL | INTRAVENOUS | Status: DC | PRN

## 2014-04-22 MED ORDER — BUPIVACAINE HCL (PF) 0.25 % IJ SOLN
INTRAMUSCULAR | Status: AC
  Filled 2014-04-22: qty 30

## 2014-04-22 MED ORDER — SODIUM CHLORIDE 0.9 % IJ SOLN: 3.0000 mL | INTRAMUSCULAR | Status: DC | PRN

## 2014-04-22 MED ORDER — ONDANSETRON HCL 4 MG/2ML IJ SOLN: 4.0000 mg | Freq: Four times a day (QID) | INTRAMUSCULAR | Status: DC | PRN

## 2014-04-22 MED ORDER — ACETAMINOPHEN 325 MG PO TABS: 650.0000 mg | ORAL_TABLET | Freq: Four times a day (QID) | ORAL | Status: DC | PRN

## 2014-04-22 MED ORDER — LISINOPRIL 5 MG PO TABS: 5.0000 mg | ORAL_TABLET | Freq: Every day | ORAL | Status: DC

## 2014-04-22 MED ORDER — DOBUTAMINE IN D5W 4-5 MG/ML-% IV SOLN
INTRAVENOUS | Status: AC
  Filled 2014-04-22: qty 250

## 2014-04-22 NOTE — CV Procedure (Signed)
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  Preprocedure diagnoses: SVT  Post procedure diagnoses: AV node reentrant tachycardia  Description of the procedure: After informed consent was obtained, the patient was taken to the diagnostic electrophysiology laboratory in the fasting state. After the usual preparation and draping, intravenous Versed and fentanyl were used for sedation. A 6 French quadripolar catheter was inserted percutaneously into the right femoral vein and advanced to the right ventricle. A 6 French quadripolar catheter was inserted percutaneously into the right femoral vein and advanced to the His bundle region. A 6 French hexapolar catheter was inserted percutaneously into the right jugular vein and advanced to the coronary sinus. After measurement of the basic intervals, rapid ventricular pacing was carried out from the right ventricle and decremented down to 400 ms where VA block was observed.  The atrial activation was midline and decremental. Next, programmed ventricular stimulation was carried out at 600 ms. The S1S2 interval was decreased down to 370 ms where VA block was observed. The atrial activation was midline and decremental. Next, programmed atrial stimulation was carried out from the coronary sinus and the patient's cycle length of 600 ms. The S1-S2 interval was stepwise decreased down to 380 ms. AV node ERP was observed. During programmed atrial stimulation, there was an AH jump but no echo beats initially. Next, rapid atrial pacing was carried out from the coronary sinus and stepwise decreased from 600 ms down to 460 ms AV block was demonstrated. During rapid atrial pacing, the PR interval was equal to the RR interval but there is no inducible SVT. At this point, dobutamine was infused and additional rapid atrial pacing and programmed atrial stimulation was carried out. There is  no inducible SVT. The apparent effect of dobutamine was minimal. At this point isoproterenol was infused at rates from 2-4 mics per minute. Additional rapid atrial pacing and programmed atrial stimulation was carried out. There are multiple AH Jumps, echo beats, double echo beats, and triple echo beats observed, but there is no inducible SVT. With the patient's prior history of SVT requiring and successfully converted with adenosine, the decision was made to proceed with ablation of the patient's slow pathway. It should be noted that her echo beats were midline. The VA interval of her nonsustained SVT was 0. 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  A 7 French quadripolar ablation catheter was then maneuvered by way of the right femoral vein into the right atrium. Mapping was carried out. Kochs triangle was small. 5 radiofrequency energy applications were delivered. Prolonged accelerated junctional rhythm was observed. Following catheter ablation, additional rapid atrial pacing and programmed atrial stimulation was carried out, demonstrating no evidence of slow pathway conduction. The patient was observed for 15 minutes. During this time rapid ventricular pacing, programmed ventricular stimulation, programmed atrial stimulation, and rapid atrial pacing were all carried out. There were no inducible arrhythmias. The catheters were removed and the patient was returned to the holding area for sheath removal.  Complications: There are no immediate procedure complications.  Results. A. Baseline ECG. The baseline ECG demonstrated normal sinus rhythm with no ventricular preexcitation. B. Baseline intervals. The sinus node cycle length was 1015 ms. The AH interval was 105 ms. The HV interval was 35 ms. The QRS duration was 85 ms. C. Rapid ventricular pacing. Rapid ventricular pacing was carried out from the right ventricle, where VA block was demonstrated at 400 ms.  During  rapid ventricular pacing following ablation, the atrial activation was midline and decremental.  D. Programmed ventricular stimulation. Program ventricular stimulation was carried out in the right ventricle at a cycle length of 600 ms. The S1-S2 interval was stepwise decreased from 540 ms, down to 370 ms. Following ablation, during program ventricular stimulation, the atrial activation was midline and decremental. E. Programmed atrial stimulation.  programmed atrial stimulation was carried out from the coronary sinus at a pacing cycle length of 600 ms. The S1-S2 interval was stepwise decreased from 540 ms down to 380 ms, with AV node ERP was observed. During programmed atrial stimulation, following ablation, there is no inducible SVT. F. Rapid atrial pacing. Rapid atrial pacing was carried out from the coronary sinus in the high right atrium at a pacing cycle length of 460 ms. Prior to ablation, there is inducible SVT. Following ablation, AV block was demonstrated at 470 ms. G. Arrhythmias observed. AV node reentrant tachycardia. Cycle length 320 ms. Method of activation was with rapid atrial pacing on isuprel. Method of termination, spontaneous H. Mapping. Mapping of the patient's slow pathway demonstrated a very small slow pathway.  I. Radiofrequency energy application. 5 radiofrequency energy applications were delivered. During radiofrequency energy application, there was accelerated junctional rhythm.  Conclusion: Successful electrophysiology study and catheter ablation of AV node reentrant tachycardia, which was unfortunately nonsustained during the EP procedure. Following catheter ablation, there was no SVT and no residual slow pathway conduction.Cristopher Peru, M.D.

## 2014-04-22 NOTE — Discharge Summary (Signed)
Discharge Summary   Patient ID: Latasha [name],  MRN: [id number], DOB/AGE: [birth year] 58 y.o.  Admit date: 04/22/2014 Discharge date: 04/22/2014  Primary Care Provider: [facility] Primary Cardiologist: Dr. Fletcher Anon Primary Electrophysiologist: Dr. Caryl Comes  Discharge Diagnoses Principal Problem:   PSVT (paroxysmal supraventricular tachycardia) - successful EP ablation Active Problems:   Coronary artery disease   Hyperlipidemia   SVT (supraventricular tachycardia)   Allergies Allergies  Allergen Reactions  . Bee Venom Swelling  . Codeine Nausea And Vomiting    Other reaction(s): Pruritic rash  . Penicillin G     Other reaction(s): Pruritic rash  . Sulfa Antibiotics Other (See Comments)    Other reaction(s): Pruritic rash  . Penicillins Rash    Procedures  EP Study with Ablation  Procedure: Electrophysiology study and radiofrequency catheter ablation of AV node reentrant tachycardia.  Preprocedure diagnoses: SVT  Post procedure diagnoses: AV node reentrant tachycardia  Results. A. Baseline ECG. The baseline ECG demonstrated normal sinus rhythm with no ventricular preexcitation. B. Baseline intervals. The sinus node cycle length was 1002 ms. The AH interval was 77 ms. The HV interval was 54 ms. The QRS duration was 77 ms. C. Rapid ventricular pacing. Rapid ventricular pacing was carried out from the right ventricle, where VA block was demonstrated at 380 ms. During rapid ventricular pacing following ablation, the atrial activation was midline and decremental.  D. Programmed ventricular stimulation. Program ventricular stimulation was carried out in the right ventricle at a cycle length of 500 ms. The S1-S2 interval was stepwise decreased from 440 ms, down to 300 ms. Following ablation, during program ventricular stimulation, the atrial activation was midline and decremental. E. Programmed atrial stimulation. programmed atrial stimulation was carried out from the coronary  sinus at a pacing cycle length of 500 ms. The S1-S2 interval was stepwise decreased from 440 ms down to 300 ms, were the AV node ERP was observed. During programmed atrial stimulation, following ablation, there is no inducible SVT. F. Rapid atrial pacing. Rapid atrial pacing was carried out from the coronary sinus in the high right atrium at a pacing cycle length of 490 ms. Prior to ablation, there is inducible SVT. Following ablation, AV block was demonstrated at 320 ms on isuprel. G. Arrhythmias observed. AV node reentrant tachycardia. Cycle length 340 ms. Method of activation was with rapid atrial pacing on isuprel. Method of termination, RAP. H. Mapping. Mapping of the patient's slow pathway demonstrated a very small slow pathway.  I. Radiofrequency energy application. 5 radiofrequency energy applications were delivered. During radiofrequency energy application, there was accelerated junctional rhythm.  Conclusion: Successful electrophysiology study and catheter ablation of AV node reentrant tachycardiia with 5 RF energy applications. Following catheter ablation, there was no SVT and no residual slow pathway conduction both with and without isuprel.    Hospital Course  The patient is a 58 year old female with past medical history of hyperlipidemia, coronary artery disease and PSVT.  She was referred today by Dr. Caryl Comes for consideration of catheter ablation of SVT. Of note, she has a long-standing history of tachycardia palpitations dating back to age 49. The episodes tend to start and stop suddenly and relieved with vagal maneuver and also Valsalva. She has been treated with calcium channel blocker with moderate success. However she feels tired and fatigued on diltiazem and would like to get off of this medication.  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 Lovena Le. The procedure was successful,  post ablation, there was no inducible SVT or residual slow pathway conduction.   Patient was seen post ablation, at which time her right groin and R IJ cath site appeared to be stable without significant hematoma or bleeding. She is deemed stable for discharge from cardiology perspective. I have left message with our scheduler to contact patient to schedule followup in our [place] office in 2 weeks   Discharge Vitals Blood pressure 103/57, pulse 78, temperature 97.6 F (36.4 C), temperature source Oral, resp. rate 18, height 5\' 4"  (1.626 m), weight 164 lb (74.39 kg), SpO2 98 %.  Filed Weights   04/22/14 0543 04/22/14 1207  Weight: 164 lb (74.39 kg) 164 lb (74.39 kg)    Labs   Disposition  Pt is being discharged home today in good condition.  Follow-up Plans & Appointments      Follow-up Information    Follow up with Kathlyn Sacramento, MD.   Specialty:  Cardiology   Why:  Office will contact you in 2 busness, if you do not hear from [place], please call    Contact information:   [address] [address] [phone]       Discharge Medications    Medication List    STOP taking these medications        diltiazem 240 MG 24 hr capsule  Commonly known as:  DILACOR XR      TAKE these medications        acetaminophen 325 MG tablet  Commonly known as:  TYLENOL  Take 650 mg by mouth every 6 (six) hours as needed for mild pain.     albuterol 108 (90 BASE) MCG/ACT inhaler  Commonly known as:  PROVENTIL HFA;VENTOLIN HFA  Inhale 2 puffs into the lungs every 4 (four) hours as needed for wheezing or shortness of breath.     aspirin EC 81 MG tablet  Take 81 mg by mouth daily.     cetirizine 10 MG tablet  Commonly known as:  ZYRTEC  Take 10 mg by mouth daily.     lisinopril 5 MG tablet  Commonly known as:  PRINIVIL,ZESTRIL  Take 5 mg by mouth daily.         Duration of Discharge Encounter   Greater than 30 minutes including physician  time.  Hilbert Corrigan PA-C Pager: [phone] 04/22/2014, 6:00 PM   EP Attending  Patient seen and examined. Agree with above.  Mikle Bosworth.D.

## 2014-04-22 NOTE — Progress Notes (Signed)
Report called to libby tomlin RN. Pt transported by bed to 2W in no acute distress. Cath site intact, no bleeding, no hematoma. Hob at 20, IJ sheath removed without complication. Gauze in place.

## 2014-04-22 NOTE — Progress Notes (Signed)
Pt is being discharged home. Pt has been provided with discharge instructions. RN answered all questions the patient had. She is being transported home by her husband at the bedside

## 2014-04-22 NOTE — Progress Notes (Signed)
Pt arrived from cath lab in no acute distress. No chest pain, no sob. Hob flat, advised pt to remain flat until instructed otherwise. Venous access x 2  Arterial x 1. Pedal pulses bil equal. Blood aspirated from arterial site by C fowler RN, aterial access removed by C. Fowler without complication. Venous sheaths removed. No obvious distress. Noted.

## 2014-04-22 NOTE — H&P (Signed)
HPI Latasha [name] is referred today by Dr. Caryl Comes for consideration of catheter ablation of SVT. The patient is a 58 year old woman with a long-standing history of tachycardia palpitations dating back to age 39. At that time, she had documented SVT at 200 beats per minute. Since then she has had recurrent episodes which have worsened in the last year. The episodes start and stop suddenly, and can be relieved by vagal maneuvers or Valsalva. She has not required adenosine. She has been treated with calcium channel blockers with moderate success. However she feels tired and fatigued, and would like to get off of these medications. Allergies  Allergen Reactions  . Codeine Nausea And Vomiting    Other reaction(s): Pruritic rash  . Penicillin G     Other reaction(s): Pruritic rash  . Sulfa Antibiotics Other (See Comments)    Other reaction(s): Pruritic rash  . Penicillins Rash     Current Outpatient Prescriptions  Medication Sig Dispense Refill  . acetaminophen (TYLENOL) 325 MG tablet Take 650 mg by mouth every 6 (six) hours as needed.    Marland Kitchen albuterol (PROVENTIL HFA;VENTOLIN HFA) 108 (90 BASE) MCG/ACT inhaler Inhale 2 puffs into the lungs every 4 (four) hours as needed for wheezing or shortness of breath.    Marland Kitchen aspirin 81 MG tablet Take 81 mg by mouth daily.    . cetirizine (ZYRTEC) 10 MG tablet Take 10 mg by mouth daily.    Marland Kitchen diltiazem (DILACOR XR) 240 MG 24 hr capsule Take 1 capsule (240 mg total) by mouth daily. 30 capsule 6  . lisinopril (PRINIVIL,ZESTRIL) 5 MG tablet Take 1 tablet (5 mg total) by mouth daily. 30 tablet 6   No current facility-administered medications for this visit.     Past Medical History  Diagnosis Date  . PSVT (paroxysmal supraventricular tachycardia)   . Coronary artery disease     Cardiac catheterization in January of 2015 showed mild nonobstructive three-vessel coronary artery  disease with significant mid LAD spasm which improved with nitroglycerin. Metoprolol was switched to diltiazem  . Hyperlipidemia     ROS:  All systems reviewed and negative except as noted in the HPI.   Past Surgical History  Procedure Laterality Date  . Tonsillectomy    . Tubal ligation    . Cardiac catheterization  06/2013    [facility]     Family History  Problem Relation Age of Onset  . Stroke Mother   . Heart attack Father      History   Social History  . Marital Status: Divorced    Spouse Name: N/A    Number of Children: N/A  . Years of Education: N/A   Occupational History  . Not on file.   Social History Main Topics  . Smoking status: Current Some Day Smoker -- 30 years    Types: Cigarettes  . Smokeless tobacco: Not on file  . Alcohol Use: No  . Drug Use: No  . Sexual Activity: Not on file   Other Topics Concern  . Not on file   Social History Narrative  . No narrative on file     BP 142/86  Pulse 91  Ht 5\' 3"  (1.6 m)  Wt 164 lb (74.39 kg)  BMI 29.06 kg/m2  Physical Exam:  Well appearing 58 year old woman, NAD HEENT: Unremarkable Neck: No JVD, no thyromegally Back: No CVA tenderness Lungs: Clear with no wheezes, rales, or rhonchi. HEART: Regular rate rhythm, no murmurs, no rubs, no clicks Abd: soft, positive bowel sounds, no  organomegally, no rebound, no guarding Ext: 2 plus pulses, no edema, no cyanosis, no clubbing Skin: No rashes no nodules Neuro: CN II through XII intact, motor grossly intact  EKG - normal sinus rhythm with normal axis and intervals.   Assess/Plan:            PSVT (paroxysmal supraventricular tachycardia) - Evans Lance, MD at 03/22/2014 3:44 PM     Status: Written Related Problem: PSVT (paroxysmal supraventricular tachycardia)   Expand All Collapse All   I've discussed the treatment options in detail  with Latasha [name]. The risks, goals, benefits, and expectations of the procedure of catheter ablation have been discussed and she would like to proceed with catheter ablation.

## 2014-04-22 NOTE — Discharge Instructions (Signed)
No driving for 24 hours. No lifting over 5 lbs for 1 week. No sexual activity for 1 week. Keep procedure site clean & dry. If you notice increased pain, swelling, bleeding or pus, call/return!  You may shower, but no soaking baths/hot tubs/pools for 1 week.

## 2014-04-22 NOTE — Progress Notes (Signed)
Cath insertion site checked, small amount blood oozing from venous site. Pressure held for additional 5 min. 4x4 gauze placed with opsite. No hematoma. No bleeding, hob remains flat.

## 2014-04-22 NOTE — CV Procedure (Addendum)
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  Preprocedure diagnoses: SVT  Post procedure diagnoses: AV node reentrant tachycardia  Description of the procedure: After informed consent was obtained, the patient was taken to the diagnostic electrophysiology laboratory in the fasting state. After the usual preparation and draping, intravenous Versed and fentanyl were used for sedation. A 6 French quadripolar catheter was inserted percutaneously into the right femoral vein and advanced to the right ventricle. A 6 French quadripolar catheter was inserted percutaneously into the right femoral vein and advanced to the His bundle region. A 6 French hexapolar catheter was inserted percutaneously into the right jugular vein and advanced to the coronary sinus. After measurement of the basic intervals, rapid ventricular pacing was carried out from the right ventricle and decremented down to 380 ms where VA block was observed. The atrial activation was midline and decremental. Next, programmed ventricular stimulation was carried out at 500 ms. The S1S2 interval was decreased down to 300 ms where VA block was observed. The atrial activation was midline and decremental. Next, programmed atrial stimulation was carried out from the coronary sinus and the patient's cycle length of 500 ms. The S1-S2 interval was stepwise decreased down to 300 ms. AV node ERP was observed. During programmed atrial stimulation, there was an AH jump and echo beats initially. Next, rapid atrial pacing was carried out from the coronary sinus and stepwise decreased from 600 ms down to 320 ms AV block was demonstrated. During rapid atrial pacing, the PR interval was equal to the RR interval but there is no inducible SVT. At this point isoproterenol was infused at rates from 2-4 mics per minute. Additional rapid atrial pacing and programmed atrial  stimulation was carried out. There are multiple AH Jumps, echo beats and inducible SVT. The cycle length was 340 ms. PVC's placed at the time of His bundle refractoriness demonstrated no atrial pre-excitation. During SVT, ventricular pacing demonstrated a VAV conduction sequence. A diagnosis of AV node reentrant tachycardia was made.   A 7 French quadripolar ablation catheter was then maneuvered by way of the right femoral vein into the right atrium. Mapping was carried out. Kochs triangle was very small. 7 radiofrequency energy applications were delivered. Prolonged accelerated junctional rhythm was observed. Following catheter ablation, additional rapid atrial pacing and programmed atrial stimulation was carried out, with and without isuprel demonstrating no evidence of slow pathway conduction. The patient was observed for 15 minutes. During this time programmed atrial stimulation and rapid atrial pacing were  carried out. There were no inducible arrhythmias. The catheters were removed and the patient was returned to the holding area for sheath removal.  Complications: There are no immediate procedure complications.  Results. A. Baseline ECG. The baseline ECG demonstrated normal sinus rhythm with no ventricular preexcitation. B. Baseline intervals. The sinus node cycle length was 1002 ms. The AH interval was 77 ms. The HV interval was 54 ms. The QRS duration was 77 ms. C. Rapid ventricular pacing. Rapid ventricular pacing was carried out from the right ventricle, where VA block was demonstrated at 380 ms. During rapid ventricular pacing following ablation, the atrial activation was midline and decremental.  D. Programmed ventricular stimulation. Program ventricular stimulation was carried out in the right ventricle at a cycle length of 500 ms. The S1-S2 interval was stepwise decreased from 440 ms, down to 300 ms. Following ablation, during program ventricular stimulation, the atrial activation was  midline and decremental. E. Programmed  atrial stimulation. programmed atrial stimulation was carried out from the coronary sinus at a pacing cycle length of 500 ms. The S1-S2 interval was stepwise decreased from 440 ms down to 300 ms, were the AV node ERP was observed. During programmed atrial stimulation, following ablation, there is no inducible SVT. F. Rapid atrial pacing. Rapid atrial pacing was carried out from the coronary sinus in the high right atrium at a pacing cycle length of 490 ms. Prior to ablation, there is inducible SVT. Following ablation, AV block was demonstrated at 320 ms on isuprel. G. Arrhythmias observed. AV node reentrant tachycardia. Cycle length 340 ms. Method of activation was with rapid atrial pacing on isuprel. Method of termination, RAP. H. Mapping. Mapping of the patient's slow pathway demonstrated a very small slow pathway.  I. Radiofrequency energy application. 5 radiofrequency energy applications were delivered. During radiofrequency energy application, there was accelerated junctional rhythm.  Conclusion: Successful electrophysiology study and catheter ablation of AV node reentrant tachycardiia with 5 RF energy applications. Following catheter ablation, there was no SVT and no residual slow pathway conduction both with and without isuprel.  Cristopher Peru, M.D.

## 2014-05-16 ENCOUNTER — Telehealth: Payer: Self-pay | Admitting: Internal Medicine

## 2014-05-16 DIAGNOSIS — I499 Cardiac arrhythmia, unspecified: Secondary | ICD-10-CM

## 2014-05-16 NOTE — Telephone Encounter (Signed)
Calling stating she had an ablation on 11/6.  Has noticed the past few days has felt an irregular heart rate.  Yesterday she states she vomited like she did when admitted to hospital with fast heart rate.  No vomiting today.  States when feels pulse at [place] feels irregular.  Spoke w/Dr. Lovena Le who advises for her to have 48 hr monitor.  Advised pt that schedulers will call her to set that app up.  She verbalizes understanding and if possible be done in [place].

## 2014-05-16 NOTE — Telephone Encounter (Signed)
New message           Pt is holding on the line at [facility] unable to transfer here / complaining of chest tightness / please give pt a call

## 2014-05-16 NOTE — Telephone Encounter (Signed)
I received a call from Latasha [name] about having elevated blood pressures of 150s/100s. Patient endorses mild headache but no symptoms of chest pain, pressure, shortness of breath. She takes lisinopril 5mg  daily for HTN. She recently had an AVNRT ablation done by Dr. Lovena Le. At that point, patient was also taking diltiazem 240 milligrams daily. I informed the patient that her blood pressure at the moment was in the safe zone. I asked patient to look out for the symptoms of hypertensive urgency and contact us should they arise. More instructions will be made by her primary care provider and Dr. Lovena Le in the morning.

## 2014-05-17 NOTE — Telephone Encounter (Signed)
Ok to restart cardizem if blood pressure remains elevated. GT

## 2014-05-26 ENCOUNTER — Encounter (HOSPITAL_COMMUNITY): Payer: Self-pay | Admitting: Internal Medicine

## 2014-10-07 NOTE — Consult Note (Signed)
PATIENT NAME:  Latasha [name], Latasha [name] MR#:  [id number] DATE OF BIRTH:  1956/01/15  DATE OF CONSULTATION:  11/18/2012  REFERRING PHYSICIAN:  Dr. Lovena Le, ER physician CONSULTING PHYSICIAN:  Tana Conch. Leslye Peer, MD  PRIMARY CARE PHYSICIAN:  Dr. Eliberto Ivory  CARDIOLOGIST:  Dr. Saralyn Pilar    CHIEF COMPLAINT:  Palpitations.   HISTORY OF PRESENT ILLNESS: This is a 59 year old female with history of supraventricular tachycardia, 5 times before. She presents to the ER 12 hours after she felt palpitations happening. It  started 7:30 the evening prior and she came to the ER at 7:30 in the morning. She was unable to sleep. She was very restless. She could not get comfortable. She felt weak, palpitations and also chest pressure associated with shortness of breath, diaphoresis, some nausea. She felt like a beach ball was in her chest, like there was not enough room in her chest and the chest pain immediately went away after conversion back to normal sinus rhythm in the Emergency Room. Her last episode was on Sunday of supraventricular tachycardia, but it did not last very long and prior to that was 3 years prior. Hospitalist services were contacted for further evaluation.   PAST MEDICAL HISTORY: Supraventricular tachycardia 5 times in the past, hypertension, tobacco abuse.   PAST SURGICAL HISTORY: Tonsillectomy and tubal ligation.   ALLERGIES: CODEINE, PENICILLIN AND SULFA DRUGS.   MEDICATIONS: Include, metoprolol 50 mg b.i.d.,  p.r.n., Tylenol.   SOCIAL HISTORY: Smokes less than 1 pack per day. No alcohol. No drug use. 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   FAMILY HISTORY: Father died  at 44 of a myocardial infarction. Mother died at 63 of a CVA.   REVIEW OF SYSTEMS:  CONSTITUTIONAL: Positive for diaphoresis. No fever or chills. No weight gain. No weight loss. No weakness or fatigue  EYES: She states her vision has gotten worse. She does wear glasses.  EARS, NOSE, MOUTH, AND THROAT: Positive for runny nose and sinus drainage.  No difficulty swallowing.  CARDIOVASCULAR: Positive for chest pain, which resolved after conversion to normal sinus rhythm.  RESPIRATORY: Positive for shortness of breath. Positive for cough. No hemoptysis.  GASTROINTESTINAL: Positive for nausea, did have vomiting on Sunday. No abdominal pain. No diarrhea. No constipation. No bright red blood per rectum. No melena.  GENITOURINARY: No burning on urination or hematuria.  MUSCULOSKELETAL: Positive for joint pain.  INTEGUMENT: No rashes or eruptions.  NEUROLOGIC: No fainting or blackouts.  PSYCHIATRIC: No anxiety or depression.  ENDOCRINE: The thyroid problems.  HEMATOLOGIC AND LYMPHATIC: No anemia.   PHYSICAL EXAMINATION: VITAL SIGNS: Temperature 97, pulse 84, respirations 18, blood pressure 116/81 and pulse oximetry 97% on oxygen. Vital signs on presentation to the ER included a pulse of 180, blood pressure 63/49, respirations 20, temperature 97  GENERAL: No respiratory distress.  EYES: Conjunctivae and lids normal. Pupils equal, round, and reactive to light. Extraocular muscles intact. No nystagmus.  EARS, NOSE, MOUTH, AND THROAT: Tympanic membranes: No erythema. Nasal mucosa: No erythema. Throat:  No erythema. No exudate seen. Lips and gums: No lesions.  NECK: No JVD. No bruits. No lymphadenopathy. No thyromegaly. No thyroid nodules palpated.  LUNGS: Clear to auscultation. No use of accessory muscles to breathe. No rhonchi, rales, or wheeze heard.  CARDIOVASCULAR: S1, S2 normal. No gallops, rubs or murmurs heard. Carotid upstroke 2+ bilaterally. No bruits.  EXTREMITIES: Dorsalis pedis pulses 2+ bilaterally. No edema of the lower extremity.  ABDOMEN: Soft, nontender. No organomegaly/splenomegaly. Normoactive bowel sounds. No masses felt.  LYMPHATIC: No lymph nodes  in the neck.  MUSCULOSKELETAL: No clubbing, edema or cyanosis.   SKIN: No rashes or ulcers seen.  NEUROLOGIC: Cranial nerves II through XII grossly intact. Deep tendon reflexes are  1+ bilateral lower extremities.  PSYCHIATRIC: The patient is oriented to person, place and time.   LABORATORY AND RADIOLOGICAL DATA: First EKG showed supraventricular tachycardia with poor R wave progression. EKG showed normal sinus rhythm with sinus arrhythmia 72 beats per minute   Chest x-ray: Mild increased interstitial markings mid and lower lung zones. Atelectasis is suspected.   First troponin 0.55. White blood cell count 11.3, hemoglobin and hematocrit 13.9 and 41.5, platelet count 145, glucose 110, BUN 25, creatinine 1.51, sodium 136, potassium 4.7, chloride 103, CO2 25, calcium 8.5.   Liver function tests: ALT elevated at 210, AST 280. Next troponin borderline at 0.67.   HOSPITAL COURSE PER PROBLEM LIST:  1.  For the patient's supraventricular tachycardia, this broke with adenosine. I spoke with Dr. Nehemiah Massed covering for Dr. Saralyn Pilar. He advised to continue the patient's current dose of metoprolol 50 mg twice a day and quick follow-up in the office 9:00 a.m. tomorrow.  2.  Elevated troponin. This is likely demand ischemia from rapid heart rate. Chest pain resolved immediately after conversion to normal sinus rhythm. We will continue aspirin and metoprolol. I did speak with Dr. Nehemiah Massed prior to discharging from the ER and follow up in the office.  3.  Hypertension. Blood pressure was low initially with the rapid heart rate, normal upon discharge. Continue metoprolol.  4.  Tobacco abuse. Smoking cessation counseling done, 3 minutes by me. The patient wanted to go cold [place].  5.  Elevated liver function tests. Not sure if this is secondary to the rapid heart rate or another issue. The patient denied any alcohol drinking. Would recommend following up liver function tests as outpatient with Dr. Eliberto Ivory, can consider hepatitis profiles at that time. Looking back at old labs, back in February 2013, liver function tests were normal and the patient did have an ultrasound of the abdomen back in  February 2013 that was unremarkable. So at this point, would recommend following up liver function tests.   Of note, when I did go back to the room to discuss the plan with the patient about discharge home and speaking with Dr. Nehemiah Massed on the follow-up appointment. The husband and was very upset at me speaking with Dr. Nehemiah Massed and he did not want his wife to see him. He was in the room when I spoke with the patient about calling the [facility] doctor on call and did not mention anything at that time. I said they can call the office when they get home to schedule things with Dr. Saralyn Pilar specifically and they were okay with that plan.   TIME SPENT ON PATIENT CARE: 55 minutes. The patient discharged home from the Emergency Room.   ____________________________ Tana Conch. Leslye Peer, MD rjw:cc D: 11/18/2012 15:28:32 ET T: 11/18/2012 16:30:19 ET JOB#: [id number]  cc: Tana Conch. Leslye Peer, MD, <Dictator> Dr. Domenic Polite, MD  Marisue Brooklyn MD ELECTRONICALLY SIGNED 11/21/2012 15:09

## 2014-10-08 NOTE — Consult Note (Signed)
Brief Consult Note: Diagnosis: SVT, NSTEMI.   Patient was seen by consultant.   Consult note dictated.   Comments: The MI was likely due to tachycardia. However, she had prolonged chest pain and vomiting which is concerning.  Recommend cardiac cath tomorrow.  Recommend ablation for SVT. I will discuss with EP.  Electronic Signatures: Kathlyn Sacramento (MD)  (Signed 18-Jan-15 12:52)  Authored: Brief Consult Note   Last Updated: 18-Jan-15 12:52 by Kathlyn Sacramento (MD)

## 2014-10-08 NOTE — Discharge Summary (Signed)
PATIENT NAME:  Latasha [name], Latasha [name] MR#:  [id number] DATE OF BIRTH:  [birth year]  DATE OF ADMISSION:  07/03/2013 DATE OF DISCHARGE:  07/05/2013  FINAL DIAGNOSES: 1.  Supraventricular tachycardia.  2.  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  3.  Hypertension.  4.  Acute myocardial infarction.   CODE STATUS:  FULL CODE.   MEDICATIONS ON DISCHARGE: 1.  Aspirin 81 mg once a day as needed.  2.  Ventolin 2 puff inhalation 4 times a day as needed for shortness of breath.  3.  Lisinopril 5 mg oral once a day.  5.  Cardizem 240 mg 24-hour capsule once a day.   DIET ON DISCHARGE:  Low-sodium diet.  Consistency regular.   ACTIVITY:  As tolerated.   FOLLOWUP:  Within 1 to 2 weeks with Dr. Fletcher Anon in cardiology office.   HISTORY OF PRESENTING ILLNESS:  A 59 year old female with history of SVT before on metoprolol 50 mg twice daily, came in because of chest pressure and supraventricular tachycardia, at high rate was 200, had been having palpitations since the afternoon.  Finally she decided to call ambulance and when ambulance came in the ambulance she had vomited and her SVT broke.  There was no other complaint associated with this and admitted for further management of this issue.   HOSPITAL COURSE AND STAY:  Dr. Fletcher Anon cardiology consult was done and he did the cardiac cath, found LAD spasm which was dilated with nitro.  He says just to recheck the patient and follow up in his office.  The patient also had elevated troponin which was most likely due to the episode of SVT.  She was initially started on Lovenox therapeutic dose, but after having found LAD spasm we stopped it and discharged her home.    OTHER MEDICAL ISSUES: 1.  Hypertension.  Metoprolol, we changed it to Cardizem as the patient was taking metoprolol initially.  2.  Supraventricular tachycardia.  The patient had after cardiac cath, cardiology service switched it to  Cardizem and was discharged on that one.   CONSULTATIONS IN THE HOSPITAL:  Cardiology with Dr. Fletcher Anon.   LABORATORY DATA:  Creatinine is 1.1, BUN is 17.  Potassium is 4.3.  Chest x-ray, PA and lateral, showed mild hyperinflation consistent with COPD, left lower lobe atelectasis.  Troponin was 0.91.  WBC 7000, hemoglobin 12.7.  Troponin went up then came down to 0.88.  Cardiac catheterization as mentioned above had LAD spasm which responded to nitroglycerin.   Total time spent in this discharge is 40 minutes.    ____________________________ Ceasar Lund Anselm Jungling, MD vgv:ea D: 07/08/2013 23:00:13 ET T: 07/09/2013 03:42:20 ET JOB#: [id number]  cc: Ceasar Lund. Anselm Jungling, MD, <Dictator> Muhammad A. Fletcher Anon, MD Vaughan Basta MD ELECTRONICALLY SIGNED 07/09/2013 18:56

## 2014-10-08 NOTE — H&P (Signed)
PATIENT NAME:  Latasha [name], Latasha [name] MR#:  [id number] DATE OF BIRTH:  26-Mar-1956  DATE OF ADMISSION:  07/03/2013  CONTINUATION  HISTORY OF PRESENT ILLNESS: The patient has an infected tooth, and she thinks probably that caused her chest pressure. The patient did take nitroglycerin from her husband.   MEDICATIONS AT HOME:  1.  Metoprolol 50 mg p.o. b.i.d.  2.  Ventolin 90 mcg inhalation daily. 3.  Aspirin 81 mg daily.  ALLERGIES:  CODEINE, PENICILLIN and SULFA.   SOCIAL HISTORY: Previous smoker, quit now. No alcohol. No drugs.   FAMILY HISTORY: No history of hypertension or diabetes.   REVIEW OF SYSTEMS:   CONSTITUTIONAL: Has no fever. No fatigue.  EYES: No blurred vision.  ENT: The patient had no tinnitus. No epistaxis. No difficulty swallowing.  RESPIRATIONS: No cough. No wheezing.  CARDIOVASCULAR: Had palpitations and chest pressure today. No orthopnea. No PND.  GASTROINTESTINAL: The patient did have nausea, vomiting and diarrhea today. GENITOURINARY: No dysuria.  ENDOCRINE: No polyuria or nocturia.   HEMATOLOGIC: No anemia.  INTEGUMENTARY: No skin rashes.  MUSCULOSKELETAL: No joint pain.  NEUROLOGIC: No numbness or weakness.  PSYCHIATRIC:  no anxiety, PHYSICAL EXAMINATION: VITAL SIGNS: Temperature 97.7, heart rate 91, blood pressure 91/57, sats 94% on room air when she came, but heart rate was in 200s by ambulance.  GENERAL: She is alert, awake, oriented. HEAD:  Atraumatic, normocephalic.  EYES: Pupils equally reacting to light. Extraocular movements are intact.  ENT: The patient has no tympanic membrane congestion. No turbinate hypertrophy. No oropharyngeal erythema. Mouth: The patient has very poor dentition. NECK: Supple. No JVD. No carotid bruit.  CARDIOVASCULAR: S1, S2 regular. No murmurs. The patient's PMI is not displaced. The patient's peripheral pulses are intact, carotid, femoral, dorsalis pedis.  LUNGS: Clear to auscultation. No wheeze, no rales. ABDOMEN: Soft,  nontender, nondistended. Bowel sounds present.  NEUROLOGIC: Cranial nerves II through XII intact. Power 5/5 in upper and lower extremities. (DTR  2+ bilaterally.   NEUROLOGIC: Alert, awake, oriented. No focal neurological deficit.   LAB DATA: Chest x-ray showed mild hyperinflation and COPD, left lower lobe atelectasis is suspected. Electrolytes: Sodium 139, potassium 4.3, chloride 106, bicarb 28, BUN 17, creatinine 1, glucose 88. The patient's LFTs: AST is slightly up at 54, and ALT 42. WBC 9.6, hemoglobin 13.7, hematocrit 40.5, platelets 176. Troponin 0.17. EKG: Normal sinus rhythm with 80 beats per minute.   ASSESSMENT AND PLAN:  1. The patient is a 59 year old female with supraventricular tachycardia, resolved after she vomited. Has history of SVT before. Now comes in with chest pressure and also elevated troponins. Elevated troponins likely secondary to her SVT, but because of her previous history, would like to make sure she did not have ST elevation MI. Continue to cycle troponins, continue her on aspirin, sublingual nitro, continue beta blockers. The patient wants to see Dr. Esmond Plants from now on, so will put in a consult for Cardiology.   2.  Infected tooth abscess. Can see the dentist as an outpatient.  Time spent is about 60 minutes on History and Physical.    ____________________________ Epifanio Lesches, MD sk:mr D: 07/03/2013 50:35:46 ET T: 07/03/2013 20:39:43 ET JOB#: [id number]  cc: Epifanio Lesches, MD, <Dictator> Epifanio Lesches MD ELECTRONICALLY SIGNED 07/26/2013 16:27

## 2014-10-08 NOTE — Consult Note (Signed)
PATIENT NAME:  Latasha [name], Latasha [name] MR#:  [id number] DATE OF BIRTH:  May 23, 1956  DATE OF CONSULTATION:  07/04/2013  REFERRING PHYSICIAN:   CONSULTING PHYSICIAN:  Muhammad A. Fletcher Anon, MD  PRIMARY CARE PHYSICIAN:  Dr. Eliberto Ivory.    REASON FOR CONSULTATION: Supraventricular tachycardia and possible myocardial infarction.   HISTORY OF PRESENT ILLNESS: This is a 59 year old Caucasian female with known history of paroxysmal supraventricular tachycardia. She reports at least 5 episodes of SVT. She had 1 episode in June of 2014 which required termination with adenosine in the Emergency Room. She has been on metoprolol 50 mg twice daily. She normally follows with Dr. Saralyn Pilar but she is requesting transferring care to our group. Her husband is a patient of Dr. Rockey Situ. The patient had a prolonged episode of tachycardia yesterday. This was shortly followed by substernal chest tightness associated with nausea and vomiting. EMS was called. The patient converted to sinus rhythm en route to the Emergency Room. She is currently chest pain-free. Troponin was found to be elevated at 0.91. CK-MB was also high at 5.5. She has no history of diabetes, hypertension or hyperlipidemia. She is a previous tobacco user. She reports having a stress test done last summer with Dr. Saralyn Pilar and she was told that she there were no significant abnormalities.   PAST MEDICAL HISTORY: 1.  Paroxysmal supraventricular tachycardia.  2.  Previous tobacco use.   PAST SURGICAL HISTORY: Includes tonsillectomy and tubal ligation.   ALLERGIES: INCLUDE CODEINE, PENICILLIN AND SULFA DRUGS.   HOME MEDICATIONS:  Include metoprolol 50 mg twice daily and Tylenol as needed.   SOCIAL HISTORY: She used to smoke 1 pack per day but she quit in June of last year. She denies any alcohol or recreational drug use. She works at [employer].   FAMILY HISTORY: Her father died at the age of 28 of myocardial infarction.   REVIEW OF SYSTEMS: A 10-point review of systems   was performed. It is negative other than what is mentioned in the HPI.   PHYSICAL EXAMINATION: GENERAL: The patient appears to be at her stated age, in no acute distress.  VITAL SIGNS: Temperature 98.2, pulse 66, respiratory rate 18, blood pressure 124/85 and oxygen saturation is 95% on 2 liters nasal cannula.  HEENT: Normocephalic, atraumatic.  NECK: No JVD or carotid bruits.  RESPIRATORY: Normal respiratory effort with no use of accessory muscles. Auscultation reveals normal breath sounds.  CARDIOVASCULAR: Normal PMI. Normal S1 and S2 with no gallops or murmurs.  ABDOMEN: Benign, nontender and nondistended.  EXTREMITIES: No clubbing, cyanosis or edema.  SKIN: Warm and dry with no rash.   LABORATORY, DIAGNOSTIC AND RADIOLOGICAL DATA: Renal function is normal. CBC is unremarkable. Troponin was 0.91 with a CK-MB of 5.5. EKG showed normal sinus rhythm with no significant ST or T wave changes, no evidence of delta wave. 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   IMPRESSION: 1.  Paroxysmal supraventricular tachycardia with recurrent episodes in spite of treatment with metoprolol.  2.  Non-ST-elevation myocardial infarction, could be due to supply demand ischemia but a primary cardiac event cannot be excluded.   RECOMMENDATIONS: The patient is having more frequent episodes of supraventricular tachycardia in spite of treatment with metoprolol. These episodes have been associated with significant symptoms and hemodynamic instability. Due to that, I think she needs definitive treatment for this condition and, thus, I recommend evaluation for catheter ablation. This will be addressed after ensuring no ischemic heart disease. The patient  did have elevated cardiac enzymes. Although this could be related to supply/demand ischemia, the patient had a prolonged episode of substernal chest tightness associated with the nausea and vomiting. Thus, I think it is  important to exclude underlying obstructive coronary artery disease. Due to that, I recommend proceeding with cardiac catheterization and possible coronary intervention. Risks, benefits and alternatives were discussed with the patient. The procedure will be done tomorrow. Further recommendations to follow.   ____________________________ Mertie Clause Fletcher Anon, MD maa:cs D: 07/04/2013 12:59:45 ET T: 07/04/2013 15:46:04 ET JOB#: [id number]  cc: Rogue Jury A. Fletcher Anon, MD, <Dictator> Dr. Levada Dy MD ELECTRONICALLY SIGNED 07/12/2013 9:31

## 2014-10-08 NOTE — H&P (Signed)
PATIENT NAME:  Latasha [name], OBYRNE MR#:  [id number] DATE OF BIRTH:  1955/07/16  DATE OF ADMISSION:  07/03/2013  EMERGENCY ROOM PHYSICIAN:  Dr. Lisa Roca  CHIEF COMPLAINT: SVT.  HISTORY OF PRESENT ILLNESS: The patient is a 59 year old female with history of SVT before on metoprolol 50 mg b.i.d. Comes in because of  chest pressure and SVT. The patient's heart rate was around 200s. The patient has been having palpitations since this afternoon around 12:00. Called ambulance at 2:00 because she was feeling palpitations and heart rate was 200s, by ambulance. The patient had vomiting, and then the heart rate dropped to 80s, and the SVT broke. The patient right now is asymptomatic. Heart rate has been in 80s since she is here, but her troponin is 0.12, so we are admitting her. The patient had chest pressure this afternoon, but resolved now, and denies any complaints. Says that she had chest pressure in the afternoon with palpitations. No radiation of the pain. No associated nausea. The patient did have vomiting, stated 2 to 3 times, and also had frequent diarrhea, but denies abdominal pain. No trouble breathing with the chest pain. Also no cough and no radiation of the chest pressure. No aggravating or relieving factors of the chest pressure.   PAST MEDICAL HISTORY:  Significant for history of SVT before ...dictation ended.   ____________________________ Epifanio Lesches, MD sk:mr D: 07/03/2013 19:53:00 ET T: 07/03/2013 20:29:01 ET JOB#: [id number]  cc: Epifanio Lesches, MD, <Dictator> Epifanio Lesches MD ELECTRONICALLY SIGNED 07/26/2013 16:30

## 2016-01-18 ENCOUNTER — Other Ambulatory Visit: Payer: Self-pay

## 2016-01-18 ENCOUNTER — Emergency Department
Admission: EM | Admit: 2016-01-18 | Discharge: 2016-01-18 | Disposition: A | Discharge status: Home / Self Care | Payer: 59 | Attending: Emergency Medicine | Admitting: Emergency Medicine

## 2016-01-18 ENCOUNTER — Encounter: Payer: Self-pay | Admitting: Emergency Medicine

## 2016-01-18 DIAGNOSIS — Z7951 Long term (current) use of inhaled steroids: Secondary | ICD-10-CM | POA: Insufficient documentation

## 2016-01-18 DIAGNOSIS — F17210 Nicotine dependence, cigarettes, uncomplicated: Secondary | ICD-10-CM | POA: Diagnosis not present

## 2016-01-18 DIAGNOSIS — I2510 Atherosclerotic heart disease of native coronary artery without angina pectoris: Secondary | ICD-10-CM | POA: Diagnosis not present

## 2016-01-18 DIAGNOSIS — R55 Syncope and collapse: Secondary | ICD-10-CM | POA: Diagnosis not present

## 2016-01-18 LAB — COMPREHENSIVE METABOLIC PANEL
ALT: 18 U/L (ref 14–54)
AST: 27 U/L (ref 15–41)
Albumin: 4.4 g/dL (ref 3.5–5.0)
Alkaline Phosphatase: 63 U/L (ref 38–126)
Anion gap: 8 (ref 5–15)
BUN: 11 mg/dL (ref 6–20)
CHLORIDE: 101 mmol/L (ref 101–111)
CO2: 23 mmol/L (ref 22–32)
CREATININE: 0.78 mg/dL (ref 0.44–1.00)
Calcium: 9.5 mg/dL (ref 8.9–10.3)
GFR calc Af Amer: >60 mL/min (ref >60)
GLUCOSE: 118 mg/dL — AB (ref 65–99)
Potassium: 4.2 mmol/L (ref 3.5–5.1)
SODIUM: 132 mmol/L — AB (ref 135–145)
Total Bilirubin: 0.5 mg/dL (ref 0.3–1.2)
Total Protein: 7.5 g/dL (ref 6.5–8.1)

## 2016-01-18 LAB — CBC
HCT: 41.1 % (ref 35.0–47.0)
Hemoglobin: 14.3 g/dL (ref 12.0–16.0)
MCH: 33.2 pg (ref 26.0–34.0)
MCHC: 34.7 g/dL (ref 32.0–36.0)
MCV: 95.4 fL (ref 80.0–100.0)
PLATELETS: 210 10*3/uL (ref 150–440)
RBC: 4.31 MIL/uL (ref 3.80–5.20)
RDW: 12.6 % (ref 11.5–14.5)
WBC: 10.8 10*3/uL (ref 3.6–11.0)

## 2016-01-18 LAB — TROPONIN I: Troponin I: <0.03 ng/mL (ref <0.03)

## 2016-01-18 MED ORDER — SODIUM CHLORIDE 0.9 % IV BOLUS (SEPSIS)
1000.0000 mL | Freq: Once | INTRAVENOUS | Status: AC
  Administered 2016-01-18: 1000 mL via INTRAVENOUS

## 2016-01-18 NOTE — Discharge Instructions (Signed)
Please call the number provided to arrange a Holter monitor test as soon as possible. Please return to the emergency department for any further syncopal events, feeling lightheaded/dizzy, development of any chest pain or trouble breathing.

## 2016-01-18 NOTE — ED Notes (Signed)
NAD. Waiting on disposition.

## 2016-01-18 NOTE — ED Notes (Signed)
Pt aware of need for urine specimen. 

## 2016-01-18 NOTE — ED Provider Notes (Signed)
[facility] Emergency Department Provider Note  Time seen: 10:40 AM  I have reviewed the triage vital signs and the nursing notes.   HISTORY  Chief Complaint Loss of Consciousness    HPI Latasha [name] is a 60 y.o. female with a past medical history of SVT, status post ablation, CAD, hyperlipidemia, who presents to the emergency department after a syncopal episode. According to the patient she was visiting her husband in the hospital, she states she started feeling a fullness sensation in her head with ringing in her ears. She sat down on the bed and next remembers awakening with people around her while she is on the bed. Patient denies any history of syncopal episodes in the past. She did have paroxysmal SVT status post ablation. Patient states upon awakening she is very nauseated had several episodes of vomiting and very diaphoretic. Denies any chest pain, pressure, tightness at any point. Denies any fever. Denies any recent illnesses, cough, congestion, nausea, vomiting, diarrhea. Patient states she is feeling much better at this time.  Past Medical History:  Diagnosis Date  . Coronary artery disease    Cardiac catheterization in January of 2015 showed mild nonobstructive three-vessel coronary artery disease with significant mid LAD spasm which improved with nitroglycerin. Metoprolol was switched to diltiazem  . Hyperlipidemia   . PSVT (paroxysmal supraventricular tachycardia) [facility])     Patient Active Problem List   Diagnosis Date Noted  . SVT (supraventricular tachycardia) ([place]) 04/22/2014  . PSVT (paroxysmal supraventricular tachycardia) ([place])   . Coronary artery disease   . Hyperlipidemia     Past Surgical History:  Procedure Laterality Date  . CARDIAC CATHETERIZATION  06/2013   [facility]  . SUPRAVENTRICULAR TACHYCARDIA ABLATION N/A 04/22/2014   Procedure: SUPRAVENTRICULAR TACHYCARDIA ABLATION;  Surgeon: Evans Lance, MD;  Location: [facility] CATH LAB;  Service:  Cardiovascular;  Laterality: N/A;  . TONSILLECTOMY    . TUBAL LIGATION      Prior to Admission medications   Medication Sig Start Date End Date Taking? Authorizing Provider  acetaminophen (TYLENOL) 325 MG tablet Take 650 mg by mouth every 6 (six) hours as needed for mild pain.     Historical Provider, MD  albuterol (PROVENTIL HFA;VENTOLIN HFA) 108 (90 BASE) MCG/ACT inhaler Inhale 2 puffs into the lungs every 4 (four) hours as needed for wheezing or shortness of breath.    Historical Provider, MD  aspirin EC 81 MG tablet Take 81 mg by mouth daily.    Historical Provider, MD  cetirizine (ZYRTEC) 10 MG tablet Take 10 mg by mouth daily.    Historical Provider, MD  lisinopril (PRINIVIL,ZESTRIL) 5 MG tablet Take 5 mg by mouth daily.    Historical Provider, MD    Allergies  Allergen Reactions  . Bee Venom Swelling  . Codeine Nausea And Vomiting    Other reaction(s): Pruritic rash  . Penicillin G     Other reaction(s): Pruritic rash  . Sulfa Antibiotics Other (See Comments)    Other reaction(s): Pruritic rash  . Penicillins Rash    Family History  Problem Relation Age of Onset  . Stroke Mother   . Heart attack Father     Social History Social History  Substance Use Topics  . Smoking status: Current Some Day Smoker    Years: 30.00    Types: Cigarettes  . Smokeless tobacco: Never Used  . Alcohol use No    Review of Systems Constitutional: Negative for fever. Cardiovascular: Negative for chest pain. Respiratory: Negative  for shortness of breath. Gastrointestinal: Negative for abdominal pain Musculoskeletal: Negative for back pain. Neurological: Negative for headaches, focal weakness or numbness. 10-point ROS otherwise negative.  ____________________________________________   PHYSICAL EXAM:  VITAL SIGNS: ED Triage Vitals  Enc Vitals Group     BP --      Pulse Rate 01/18/16 1031 67     Resp 01/18/16 1031 18     Temp 01/18/16 1031 97.8 F (36.6 C)     Temp Source  01/18/16 1031 Oral     SpO2 01/18/16 1031 96 %     Weight 01/18/16 1031 145 lb (65.8 kg)     Height 01/18/16 1031 '5\' 5"'$  (1.651 m)     Head Circumference --      Peak Flow --      Pain Score 01/18/16 1034 0     Pain Loc --      Pain Edu? --      Excl. in [place]? --     Constitutional: Alert and oriented. Well appearing and in no distress. Eyes: Normal exam ENT   Head: Normocephalic and atraumatic.   Mouth/Throat: Mucous membranes are moist. Cardiovascular: Normal rate, regular rhythm. No murmur Respiratory: Normal respiratory effort without tachypnea nor retractions. Breath sounds are clear  Gastrointestinal: Soft and nontender. No distention. Musculoskeletal: Nontender with normal range of motion in all extremities. Neurologic:  Normal speech and language. No gross focal neurologic deficits Skin:  Skin is warm, mildly diaphoretic. Psychiatric: Mood and affect are normal. Speech and behavior are normal.   ____________________________________________    EKG  EKG reviewed and interpreted by myself shows normal sinus rhythm at 73 bpm, narrow QRS, normal axis, normal intervals, no ST changes. Overall normal EKG.  ____________________________________________   INITIAL IMPRESSION / ASSESSMENT AND PLAN / ED COURSE  Pertinent labs & imaging results that were available during my care of the patient were reviewed by me and considered in my medical decision making (see chart for details).  The patient presents the emergency department after syncopal episode or visiting a family member on the medical floor. Patient denies any history of syncopal episodes in the past. She states she is feeling much better, mildly diaphoretic, states she is very nauseated but denies any nausea currently. We will check labs, IV hydrate and closely monitor on telemetry while awaiting lab results.  The patient's labs are largely within normal limits. Repeat troponin is negative. Patient continues to feel  normal, denies any symptoms. I discussed obtaining a urinalysis, but the patient states she is not having any urinary symptoms, and has already urinated and they did not ask her first sample, she would prefer to be discharged home at this time. I discussed with the patient the need to follow up with a cardiologist for a Holter monitor, the patient will call today to arrange the next available appointment. I discussed very strict return precautions with the patient for any further syncope episodes chest pain, shortness of breath or any lightheadedness she is to return to the emergency department for repeat evaluation and possible admission.  ____________________________________________   FINAL CLINICAL IMPRESSION(S) / ED DIAGNOSES  Syncope    Harvest Dark, MD 01/18/16 1430

## 2016-01-18 NOTE — ED Notes (Signed)
Pt alert and oriented. Appears slightly pale. C/o pain to chest and right arm. Also feels like right ear "clogged" up. NAD.

## 2016-01-18 NOTE — ED Triage Notes (Signed)
Pt was visiting her husband in the inpatient side, pt had a syncope episode , pale, diaphoretic.  " I was sitting on the bed , my ear stopped up, started feeling sweaty. Pt arrives on a stretcher on a ZOLL , with Conservation officer, historic buildings, and ED tech.

## 2016-01-18 NOTE — ED Notes (Signed)
Pt informed to call husband. He would like to check in with her. NAD. Pt given something to drink, ok per dr Kerman Passey

## 2016-04-11 DIAGNOSIS — J3089 Other allergic rhinitis: Secondary | ICD-10-CM | POA: Insufficient documentation

## 2016-11-04 ENCOUNTER — Other Ambulatory Visit: Payer: Self-pay | Admitting: Pediatrics

## 2016-11-04 ENCOUNTER — Ambulatory Visit
Admission: RE | Admit: 2016-11-04 | Discharge: 2016-11-04 | Disposition: A | Discharge status: Home / Self Care | Payer: 59 | Source: Ambulatory Visit | Attending: Pediatrics | Admitting: Pediatrics

## 2016-11-04 DIAGNOSIS — K769 Liver disease, unspecified: Secondary | ICD-10-CM | POA: Insufficient documentation

## 2016-11-04 DIAGNOSIS — R1011 Right upper quadrant pain: Secondary | ICD-10-CM | POA: Diagnosis present

## 2016-11-05 ENCOUNTER — Other Ambulatory Visit: Payer: Self-pay | Admitting: Pediatrics

## 2016-11-05 DIAGNOSIS — R160 Hepatomegaly, not elsewhere classified: Secondary | ICD-10-CM | POA: Insufficient documentation

## 2016-11-05 DIAGNOSIS — R918 Other nonspecific abnormal finding of lung field: Secondary | ICD-10-CM | POA: Insufficient documentation

## 2016-11-06 ENCOUNTER — Other Ambulatory Visit: Payer: Self-pay | Admitting: Pediatrics

## 2016-11-06 DIAGNOSIS — R160 Hepatomegaly, not elsewhere classified: Secondary | ICD-10-CM

## 2016-11-06 DIAGNOSIS — R918 Other nonspecific abnormal finding of lung field: Secondary | ICD-10-CM

## 2016-11-07 ENCOUNTER — Ambulatory Visit
Admission: RE | Admit: 2016-11-07 | Discharge: 2016-11-07 | Disposition: A | Discharge status: Home / Self Care | Payer: 59 | Source: Ambulatory Visit | Attending: Pediatrics | Admitting: Pediatrics

## 2016-11-07 DIAGNOSIS — N6310 Unspecified lump in the right breast, unspecified quadrant: Secondary | ICD-10-CM | POA: Insufficient documentation

## 2016-11-07 DIAGNOSIS — I700 Atherosclerosis of aorta: Secondary | ICD-10-CM | POA: Diagnosis not present

## 2016-11-07 DIAGNOSIS — R0781 Pleurodynia: Secondary | ICD-10-CM | POA: Insufficient documentation

## 2016-11-07 DIAGNOSIS — R160 Hepatomegaly, not elsewhere classified: Secondary | ICD-10-CM | POA: Insufficient documentation

## 2016-11-07 DIAGNOSIS — I2510 Atherosclerotic heart disease of native coronary artery without angina pectoris: Secondary | ICD-10-CM | POA: Diagnosis not present

## 2016-11-07 DIAGNOSIS — J439 Emphysema, unspecified: Secondary | ICD-10-CM | POA: Diagnosis not present

## 2016-11-07 DIAGNOSIS — M898X8 Other specified disorders of bone, other site: Secondary | ICD-10-CM | POA: Diagnosis not present

## 2016-11-07 DIAGNOSIS — C3411 Malignant neoplasm of upper lobe, right bronchus or lung: Secondary | ICD-10-CM | POA: Insufficient documentation

## 2016-11-07 DIAGNOSIS — R918 Other nonspecific abnormal finding of lung field: Secondary | ICD-10-CM | POA: Diagnosis present

## 2016-11-07 DIAGNOSIS — C3412 Malignant neoplasm of upper lobe, left bronchus or lung: Secondary | ICD-10-CM | POA: Insufficient documentation

## 2016-11-07 DIAGNOSIS — N6320 Unspecified lump in the left breast, unspecified quadrant: Secondary | ICD-10-CM | POA: Diagnosis not present

## 2016-11-07 HISTORY — DX: Essential (primary) hypertension: I10

## 2016-11-07 MED ORDER — IOPAMIDOL (ISOVUE-300) INJECTION 61%
80.0000 mL | Freq: Once | INTRAVENOUS | Status: AC | PRN
  Administered 2016-11-07: 80 mL via INTRAVENOUS

## 2016-11-08 ENCOUNTER — Ambulatory Visit: Payer: 59

## 2016-11-08 ENCOUNTER — Telehealth: Payer: Self-pay | Admitting: *Deleted

## 2016-11-08 NOTE — Telephone Encounter (Signed)
Referral received from Dr. Janene Harvey at [facility]. Pt scheduled to see Dr. Grayland Ormond on Tuesday 5/29 at 2:45pm. Pt instructed to arrive at 2:30pm for registration and to bring DL/insurance cards. Pt verbalized understanding and confirmed appt. Pt stated that is having severe back pain and pain on the right side. Informed pt that will try to coordinate appt with Dr. Baruch Gouty for possible bone mets. Contact information given and instructed to call with any further questions.

## 2016-11-12 ENCOUNTER — Inpatient Hospital Stay: Payer: 59

## 2016-11-12 ENCOUNTER — Inpatient Hospital Stay: Payer: 59 | Attending: Oncology | Admitting: Oncology

## 2016-11-12 ENCOUNTER — Encounter: Payer: Self-pay | Admitting: *Deleted

## 2016-11-12 ENCOUNTER — Encounter: Payer: Self-pay | Admitting: Oncology

## 2016-11-12 VITALS — BP 139/89 | HR 116 | Temp 99.8°F | Resp 18 | Ht 65.0 in | Wt 146.0 lb

## 2016-11-12 DIAGNOSIS — I10 Essential (primary) hypertension: Secondary | ICD-10-CM | POA: Diagnosis not present

## 2016-11-12 DIAGNOSIS — R109 Unspecified abdominal pain: Secondary | ICD-10-CM | POA: Diagnosis not present

## 2016-11-12 DIAGNOSIS — Z808 Family history of malignant neoplasm of other organs or systems: Secondary | ICD-10-CM | POA: Insufficient documentation

## 2016-11-12 DIAGNOSIS — R634 Abnormal weight loss: Secondary | ICD-10-CM | POA: Insufficient documentation

## 2016-11-12 DIAGNOSIS — I2510 Atherosclerotic heart disease of native coronary artery without angina pectoris: Secondary | ICD-10-CM

## 2016-11-12 DIAGNOSIS — K769 Liver disease, unspecified: Secondary | ICD-10-CM | POA: Diagnosis not present

## 2016-11-12 DIAGNOSIS — R222 Localized swelling, mass and lump, trunk: Secondary | ICD-10-CM | POA: Diagnosis not present

## 2016-11-12 DIAGNOSIS — I700 Atherosclerosis of aorta: Secondary | ICD-10-CM | POA: Insufficient documentation

## 2016-11-12 DIAGNOSIS — M899 Disorder of bone, unspecified: Secondary | ICD-10-CM | POA: Insufficient documentation

## 2016-11-12 DIAGNOSIS — R590 Localized enlarged lymph nodes: Secondary | ICD-10-CM | POA: Insufficient documentation

## 2016-11-12 DIAGNOSIS — R160 Hepatomegaly, not elsewhere classified: Secondary | ICD-10-CM | POA: Diagnosis not present

## 2016-11-12 DIAGNOSIS — I471 Supraventricular tachycardia: Secondary | ICD-10-CM | POA: Insufficient documentation

## 2016-11-12 DIAGNOSIS — R918 Other nonspecific abnormal finding of lung field: Secondary | ICD-10-CM | POA: Insufficient documentation

## 2016-11-12 DIAGNOSIS — E785 Hyperlipidemia, unspecified: Secondary | ICD-10-CM | POA: Diagnosis not present

## 2016-11-12 DIAGNOSIS — F17210 Nicotine dependence, cigarettes, uncomplicated: Secondary | ICD-10-CM | POA: Diagnosis not present

## 2016-11-12 DIAGNOSIS — C799 Secondary malignant neoplasm of unspecified site: Secondary | ICD-10-CM

## 2016-11-12 DIAGNOSIS — R748 Abnormal levels of other serum enzymes: Secondary | ICD-10-CM | POA: Diagnosis not present

## 2016-11-12 DIAGNOSIS — Z79899 Other long term (current) drug therapy: Secondary | ICD-10-CM | POA: Diagnosis not present

## 2016-11-12 LAB — COMPREHENSIVE METABOLIC PANEL
ALBUMIN: 3.7 g/dL (ref 3.5–5.0)
ALK PHOS: 547 U/L — AB (ref 38–126)
ALT: 96 U/L — AB (ref 14–54)
AST: 146 U/L — AB (ref 15–41)
Anion gap: 11 (ref 5–15)
BUN: 10 mg/dL (ref 6–20)
CHLORIDE: 94 mmol/L — AB (ref 101–111)
CO2: 27 mmol/L (ref 22–32)
CREATININE: 0.7 mg/dL (ref 0.44–1.00)
Calcium: 12 mg/dL — ABNORMAL HIGH (ref 8.9–10.3)
GFR calc non Af Amer: >60 mL/min (ref >60)
GLUCOSE: 87 mg/dL (ref 65–99)
Potassium: 4.4 mmol/L (ref 3.5–5.1)
SODIUM: 132 mmol/L — AB (ref 135–145)
Total Bilirubin: 1.5 mg/dL — ABNORMAL HIGH (ref 0.3–1.2)
Total Protein: 7.4 g/dL (ref 6.5–8.1)

## 2016-11-12 LAB — CBC WITH DIFFERENTIAL/PLATELET
BASOS ABS: 0.2 10*3/uL — AB (ref 0–0.1)
Basophils Relative: 2 %
Eosinophils Absolute: 0.3 10*3/uL (ref 0–0.7)
Eosinophils Relative: 2 %
HEMATOCRIT: 42.9 % (ref 35.0–47.0)
Hemoglobin: 14.6 g/dL (ref 12.0–16.0)
LYMPHS ABS: 1.5 10*3/uL (ref 1.0–3.6)
LYMPHS PCT: 12 %
MCH: 32.3 pg (ref 26.0–34.0)
MCHC: 34 g/dL (ref 32.0–36.0)
MCV: 94.8 fL (ref 80.0–100.0)
MONO ABS: 1.3 10*3/uL — AB (ref 0.2–0.9)
Monocytes Relative: 10 %
Neutro Abs: 10.1 10*3/uL — ABNORMAL HIGH (ref 1.4–6.5)
Neutrophils Relative %: 74 %
Platelets: 326 10*3/uL (ref 150–440)
RBC: 4.52 MIL/uL (ref 3.80–5.20)
RDW: 13.4 % (ref 11.5–14.5)
WBC: 13.4 10*3/uL — ABNORMAL HIGH (ref 3.6–11.0)

## 2016-11-12 LAB — PROTIME-INR
INR: 1.01
Prothrombin Time: 13.3 seconds (ref 11.4–15.2)

## 2016-11-12 LAB — APTT: APTT: 27 s (ref 24–36)

## 2016-11-12 MED ORDER — TRAMADOL HCL 50 MG PO TABS: 50.0000 mg | ORAL_TABLET | Freq: Three times a day (TID) | ORAL | 0 refills | Status: DC | PRN

## 2016-11-12 NOTE — Progress Notes (Signed)
  Oncology Nurse Navigator Documentation  Navigator Location: [facility]Med [facility]c (11/12/16 1600) Referral date to RadOnc/MedOnc: 11/08/16 (11/12/16 1600) )Navigator Encounter Type: Initial MedOnc (11/12/16 1600)   Abnormal Finding Date: 11/04/16 (11/12/16 1600)                   Treatment Phase: Abnormal Scans (11/12/16 1600) Barriers/Navigation Needs: Financial;Coordination of Care (11/12/16 1600)   Interventions: Coordination of Care;Referrals (11/12/16 1600) Referrals: Social Work (11/12/16 1600) Coordination of Care: Appts;Radiology (11/12/16 1600)        Acuity: Level 2 (11/12/16 1600)   Acuity Level 2: Initial guidance, education and coordination as needed;Assistance expediting appointments (11/12/16 1600)   Met with patient during initial consultation with Dr. Grayland Ormond. Pt educated regarding biopsy and PET scan. All questions answered at time of visit. Pt expressed concerned about finances if has to undergo treatment. Informed pt that there is a Education officer, museum available at [place] [facility] that could help with financial concerns. Informed pt that will refer her to our social worker to discuss these needs further once diagnosis has been made. Contact info given to patient and encouraged to call with any further questions. Pt verbalized understanding.     Time Spent with Patient: 60 (11/12/16 1600)

## 2016-11-13 LAB — CEA: CEA: 1423 ng/mL — ABNORMAL HIGH (ref 0.0–4.7)

## 2016-11-13 LAB — CA 125: CA 125: 137.7 U/mL — AB (ref 0.0–38.1)

## 2016-11-13 LAB — CANCER ANTIGEN 27.29: CA 27.29: 138.6 U/mL — AB (ref 0.0–38.6)

## 2016-11-13 LAB — AFP TUMOR MARKER: AFP TUMOR MARKER: 1.9 ng/mL (ref 0.0–8.3)

## 2016-11-13 LAB — CANCER ANTIGEN 19-9: CA 19 9: 6555 U/mL — AB (ref 0–35)

## 2016-11-14 ENCOUNTER — Telehealth: Payer: Self-pay | Admitting: *Deleted

## 2016-11-14 NOTE — Telephone Encounter (Signed)
Spoke with patient regarding appt scheduled for US biopsy. Informed pt that is scheduled for her biopsy on Monday June 4th at [place], pt instructed to arrive at 8am for registration. Instructed pt to not eat or drink anything after midnight the night before her biopsy and can only take BP meds with small sip of water. Informed pt that will need driver to drive her home after procedure. Pt also informed of follow up appt for biopsy results is scheduled with Dr. Grayland Ormond on Thursday 6/7 at 11:15am. Pt read-back and confirmed all appts. Has no further questions. Verbalized understanding.

## 2016-11-15 ENCOUNTER — Other Ambulatory Visit: Payer: Self-pay | Admitting: Student

## 2016-11-15 ENCOUNTER — Other Ambulatory Visit: Payer: Self-pay | Admitting: Radiology

## 2016-11-16 DIAGNOSIS — C7A1 Malignant poorly differentiated neuroendocrine tumors: Secondary | ICD-10-CM | POA: Insufficient documentation

## 2016-11-16 DIAGNOSIS — C7A8 Other malignant neuroendocrine tumors: Secondary | ICD-10-CM | POA: Insufficient documentation

## 2016-11-16 NOTE — Progress Notes (Signed)
Latasha [name]  [phone] [phone]  ID: Latasha [name] OB: 28-Dec-1955  MR#: [id number]  DGL#:[id number]  Patient Care Team: Barbaraann Boys, MD as PCP - General (Pediatrics)  CHIEF COMPLAINT: Widespread metastatic disease.  INTERVAL HISTORY: Patient is a 61 year old female who initially presented with right flank pain. Subsequent workup included a CT scan which revealed widespread lesions in bone, liver, and lung highly suspicious for underlying malignancy. Patient continues to have pain, but otherwise feels well. She has no neurologic complaints. She does complain of some unintentional weight loss. She denies any recent fevers or illnesses. She denies any chest pain, shortness of breath, cough, or hemoptysis. She has no nausea, vomiting, constipation, or diarrhea. She has no urinary complaints. Patient otherwise feels well and offers no further specific complaints.  REVIEW OF SYSTEMS:   Review of Systems  Constitutional: Positive for malaise/fatigue and weight loss. Negative for fever.  Respiratory: Negative.  Negative for cough, hemoptysis and shortness of breath.   Cardiovascular: Negative.  Negative for chest pain and leg swelling.  Gastrointestinal: Negative.  Negative for abdominal pain, blood in stool and melena.  Genitourinary: Positive for flank pain.  Skin: Negative.   Neurological: Positive for weakness. Negative for sensory change.  Psychiatric/Behavioral: The patient is nervous/anxious.     As per HPI. Otherwise, a complete review of systems is negative.  PAST MEDICAL HISTORY: Past Medical History:  Diagnosis Date  . Coronary artery disease    Cardiac catheterization in January of 2015 showed mild nonobstructive three-vessel coronary artery disease with significant mid LAD spasm which improved with nitroglycerin. Metoprolol was switched to diltiazem  . Hyperlipidemia   . Hypertension   . PSVT (paroxysmal supraventricular  tachycardia) ([place])     PAST SURGICAL HISTORY: Past Surgical History:  Procedure Laterality Date  . CARDIAC CATHETERIZATION  06/2013   [facility]  . SUPRAVENTRICULAR TACHYCARDIA ABLATION N/A 04/22/2014   Procedure: SUPRAVENTRICULAR TACHYCARDIA ABLATION;  Surgeon: Evans Lance, MD;  Location: [facility] CATH LAB;  Service: Cardiovascular;  Laterality: N/A;  . TONSILLECTOMY    . TUBAL LIGATION      FAMILY HISTORY: Family History  Problem Relation Age of Onset  . Stroke Mother   . Hypertension Mother   . Heart attack Father   . Thyroid cancer Sister   . Stroke Sister     ADVANCED DIRECTIVES (Y/N):  N  HEALTH MAINTENANCE: Social History  Substance Use Topics  . Smoking status: Current Some Day Smoker    Years: 30.00    Types: Cigarettes  . Smokeless tobacco: Never Used  . Alcohol use No     Colonoscopy:  PAP:  Bone density:  Lipid panel:  Allergies  Allergen Reactions  . Bee Venom Swelling  . Codeine Nausea And Vomiting    Other reaction(s): Pruritic rash  . Penicillin G     Has patient had a PCN reaction causing immediate rash, facial/tongue/throat swelling, SOB or lightheadedness with hypotension: Yes Has patient had a PCN reaction causing severe rash involving mucus membranes or skin necrosis: No Has patient had a PCN reaction that required hospitalization No Has patient had a PCN reaction occurring within the last 10 years: No If all of the above answers are "NO", then may proceed with Cephalosporin use.   . Sulfa Antibiotics Other (See Comments)    Other reaction(s): Pruritic rash    Current Outpatient Prescriptions  Medication Sig Dispense Refill  . albuterol (PROVENTIL HFA;VENTOLIN HFA) 108 (90 BASE) MCG/ACT inhaler Inhale 2 puffs into  the lungs every 4 (four) hours as needed for wheezing or shortness of breath.    . ALOE PO Take by mouth.    . cetirizine (ZYRTEC) 10 MG tablet Take 10 mg by mouth daily.    Marland Kitchen ibuprofen (ADVIL,MOTRIN) 200 MG tablet Take 200 mg by mouth  every 6 (six) hours as needed.    Marland Kitchen lisinopril (PRINIVIL,ZESTRIL) 5 MG tablet Take 5 mg by mouth daily.    . traMADol (ULTRAM) 50 MG tablet Take 1 tablet (50 mg total) by mouth 3 (three) times daily as needed. 60 tablet 0   No current facility-administered medications for this visit.     OBJECTIVE: Vitals:   11/12/16 1522  BP: 139/89  Pulse: (!) 116  Resp: 18  Temp: 99.8 F (37.7 C)     Body mass index is 24.3 kg/m.    ECOG FS:1 - Symptomatic but completely ambulatory  General: Well-developed, well-nourished, no acute distress. Eyes: Pink conjunctiva, anicteric sclera. HEENT: Normocephalic, moist mucous membranes, clear oropharnyx. Lungs: Clear to auscultation bilaterally. Heart: Regular rate and rhythm. No rubs, murmurs, or gallops. Abdomen: Soft, nontender, nondistended. No organomegaly noted, normoactive bowel sounds. Musculoskeletal: No edema, cyanosis, or clubbing. Neuro: Alert, answering all questions appropriately. Cranial nerves grossly intact. Skin: No rashes or petechiae noted. Psych: Normal affect. Lymphatics: No cervical, calvicular, axillary or inguinal LAD.   LAB RESULTS:  Lab Results  Component Value Date   NA 132 (L) 11/12/2016   K 4.4 11/12/2016   CL 94 (L) 11/12/2016   CO2 27 11/12/2016   GLUCOSE 87 11/12/2016   BUN 10 11/12/2016   CREATININE 0.70 11/12/2016   CALCIUM 12.0 (H) 11/12/2016   PROT 7.4 11/12/2016   ALBUMIN 3.7 11/12/2016   AST 146 (H) 11/12/2016   ALT 96 (H) 11/12/2016   ALKPHOS 547 (H) 11/12/2016   BILITOT 1.5 (H) 11/12/2016   GFRNONAA >60 11/12/2016   GFRAA >60 11/12/2016    Lab Results  Component Value Date   WBC 13.4 (H) 11/12/2016   NEUTROABS 10.1 (H) 11/12/2016   HGB 14.6 11/12/2016   HCT 42.9 11/12/2016   MCV 94.8 11/12/2016   PLT 326 11/12/2016     STUDIES: Ct Chest W Contrast  Result Date: 11/07/2016 CLINICAL DATA:  Pt states she's had RUQ/right side pain up under ribs x2wks, pain is sharp, feels like a  baseball under ribs, nausea/vomiting/severe heartburn. Weight loss of 10 pounds in 1 week. Back pain for 2 days. History of smoking, cardiac ablation, tubal ligation, C-section. EXAM: CT CHEST, ABDOMEN, AND PELVIS WITH CONTRAST TECHNIQUE: Multidetector CT imaging of the chest, abdomen and pelvis was performed following the standard protocol during bolus administration of intravenous contrast. CONTRAST:  24mL ISOVUE-300 IOPAMIDOL (ISOVUE-300) INJECTION 61% COMPARISON:  Ultrasound of the abdomen on 11/04/2016 FINDINGS: CT CHEST FINDINGS Cardiovascular: Moderate coronary artery calcifications. The heart size is normal. There is thoracic aortic atherosclerosis. The aorta is not aneurysmal. Main pulmonary artery is upper limits normal in size. There is attenuation of the branches of the right pulmonary artery secondary to right hilar and mediastinal mass. See below. Lung interface of the mass with the right main pulmonary artery. Mediastinum/Nodes: Right hilar mass is all contiguous with spiculated mass in the right upper lobe. Right hilar and mediastinal adenopathy is 5.0 x 3.9 cm, measured on coronal image number 61 of series 6. Enlarged nodes are identified in the pretracheal region (1.8 cm), subcarinal region (2.1 cm), and superior mediastinum (0.9 cm). Axillary regions are unremarkable. Esophagus is  normal in appearance. Lungs/Pleura: Marked emphysematous changes are identified throughout the lungs, primarily involving the apices. Spiculated mass is identified within the right upper lobe, contiguous with adenopathy in the right hilar region. The spiculated mass is estimated to be 3.0 x 2.7 cm. Within the left upper lobe, there is a solid and spiculated mass which measures 1.3 x 1.7 cm. Lesion is suspicious for synchronous primary. A 5 mm nodule is identified in the left lower lobe, best seen on image 78 of series 5. There is marked narrowing and near complete occlusion of the right upper lobe bronchus by right hilar  /right upper lobe mass. Long interface of the hilar mass with the trachea, right mainstem bronchus, bronchus intermedius, and lower lobe bronchus. Musculoskeletal: Numerous lytic lesions in soft tissue mass are identified within the right seventh rib. There are small lytic foci within the vertebral bodies T9, T10. Additional: Somewhat nodular appearance to the breast tissue in the retroareolar regions bilaterally, right greater than left. Further evaluation is indicated. CT ABDOMEN PELVIS FINDINGS Hepatobiliary: The liver is enlarged by a numerous low-attenuation lesions. Lesions range in size from 5 mm to 3 cm. Portal vein is patent. Gallbladder is present and displaced into the pelvis by the enlarged liver. Pancreas: Unremarkable. No pancreatic ductal dilatation or surrounding inflammatory changes. Spleen: Normal in size without focal abnormality. Adrenals/Urinary Tract: Adrenal glands are unremarkable. Kidneys are normal, without renal calculi, focal lesion, or hydronephrosis. Bladder is unremarkable. Stomach/Bowel: The stomach and small bowel loops are normal in appearance. Colon is normal in appearance. The appendix is well seen and has a normal appearance. Vascular/Lymphatic: There is significant atherosclerotic calcification of the abdominal aorta. Reproductive: The uterus is present.  No adnexal mass. Other: No free pelvic fluid. Musculoskeletal: Mild degenerative changes are seen in the lumbar spine. IMPRESSION: 1. 3.0 cm spiculated right upper lobe mass and significant right hilar and mediastinal adenopathy. Findings are compatible with malignancy. There is associated near occlusion of the right upper lobe bronchus. 2. Left upper lobe mass 1.7 cm suspicious for synchronous primary. 3. 5 mm nodule in the left lower lobe. 4. Marked hepatomegaly secondary to extensive metastatic disease. 5. Bilateral breast nodules, right greater than left. Bilateral diagnostic mammogram and bilateral breast ultrasound are  indicated for further evaluation. 6. Normal adrenal glands. 7. Osseous metastatic disease including expansion and lytic lesion of the right seventh rib as well as vertebral bodies T9 and T10. 8. Coronary artery disease. 9. Aortic atherosclerosis. The salient findings were discussed with Barbaraann Boys on 11/07/2016 at 12:34 pm. Electronically Signed   By: Nolon Nations M.D.   On: 11/07/2016 12:35   Ct Abdomen Pelvis W Contrast  Result Date: 11/07/2016 CLINICAL DATA:  Pt states she's had RUQ/right side pain up under ribs x2wks, pain is sharp, feels like a baseball under ribs, nausea/vomiting/severe heartburn. Weight loss of 10 pounds in 1 week. Back pain for 2 days. History of smoking, cardiac ablation, tubal ligation, C-section. EXAM: CT CHEST, ABDOMEN, AND PELVIS WITH CONTRAST TECHNIQUE: Multidetector CT imaging of the chest, abdomen and pelvis was performed following the standard protocol during bolus administration of intravenous contrast. CONTRAST:  69mL ISOVUE-300 IOPAMIDOL (ISOVUE-300) INJECTION 61% COMPARISON:  Ultrasound of the abdomen on 11/04/2016 FINDINGS: CT CHEST FINDINGS Cardiovascular: Moderate coronary artery calcifications. The heart size is normal. There is thoracic aortic atherosclerosis. The aorta is not aneurysmal. Main pulmonary artery is upper limits normal in size. There is attenuation of the branches of the right pulmonary artery secondary to  right hilar and mediastinal mass. See below. Lung interface of the mass with the right main pulmonary artery. Mediastinum/Nodes: Right hilar mass is all contiguous with spiculated mass in the right upper lobe. Right hilar and mediastinal adenopathy is 5.0 x 3.9 cm, measured on coronal image number 61 of series 6. Enlarged nodes are identified in the pretracheal region (1.8 cm), subcarinal region (2.1 cm), and superior mediastinum (0.9 cm). Axillary regions are unremarkable. Esophagus is normal in appearance. Lungs/Pleura: Marked emphysematous  changes are identified throughout the lungs, primarily involving the apices. Spiculated mass is identified within the right upper lobe, contiguous with adenopathy in the right hilar region. The spiculated mass is estimated to be 3.0 x 2.7 cm. Within the left upper lobe, there is a solid and spiculated mass which measures 1.3 x 1.7 cm. Lesion is suspicious for synchronous primary. A 5 mm nodule is identified in the left lower lobe, best seen on image 78 of series 5. There is marked narrowing and near complete occlusion of the right upper lobe bronchus by right hilar /right upper lobe mass. Long interface of the hilar mass with the trachea, right mainstem bronchus, bronchus intermedius, and lower lobe bronchus. Musculoskeletal: Numerous lytic lesions in soft tissue mass are identified within the right seventh rib. There are small lytic foci within the vertebral bodies T9, T10. Additional: Somewhat nodular appearance to the breast tissue in the retroareolar regions bilaterally, right greater than left. Further evaluation is indicated. CT ABDOMEN PELVIS FINDINGS Hepatobiliary: The liver is enlarged by a numerous low-attenuation lesions. Lesions range in size from 5 mm to 3 cm. Portal vein is patent. Gallbladder is present and displaced into the pelvis by the enlarged liver. Pancreas: Unremarkable. No pancreatic ductal dilatation or surrounding inflammatory changes. Spleen: Normal in size without focal abnormality. Adrenals/Urinary Tract: Adrenal glands are unremarkable. Kidneys are normal, without renal calculi, focal lesion, or hydronephrosis. Bladder is unremarkable. Stomach/Bowel: The stomach and small bowel loops are normal in appearance. Colon is normal in appearance. The appendix is well seen and has a normal appearance. Vascular/Lymphatic: There is significant atherosclerotic calcification of the abdominal aorta. Reproductive: The uterus is present.  No adnexal mass. Other: No free pelvic fluid. Musculoskeletal:  Mild degenerative changes are seen in the lumbar spine. IMPRESSION: 1. 3.0 cm spiculated right upper lobe mass and significant right hilar and mediastinal adenopathy. Findings are compatible with malignancy. There is associated near occlusion of the right upper lobe bronchus. 2. Left upper lobe mass 1.7 cm suspicious for synchronous primary. 3. 5 mm nodule in the left lower lobe. 4. Marked hepatomegaly secondary to extensive metastatic disease. 5. Bilateral breast nodules, right greater than left. Bilateral diagnostic mammogram and bilateral breast ultrasound are indicated for further evaluation. 6. Normal adrenal glands. 7. Osseous metastatic disease including expansion and lytic lesion of the right seventh rib as well as vertebral bodies T9 and T10. 8. Coronary artery disease. 9. Aortic atherosclerosis. The salient findings were discussed with Barbaraann Boys on 11/07/2016 at 12:34 pm. Electronically Signed   By: Nolon Nations M.D.   On: 11/07/2016 12:35   US Abdomen Limited Ruq  Result Date: 11/04/2016 CLINICAL DATA:  Right upper quadrant pain EXAM: US ABDOMEN LIMITED - RIGHT UPPER QUADRANT COMPARISON:  07/31/2011 FINDINGS: Gallbladder: No gallstones or wall thickening visualized. No sonographic Murphy sign noted by sonographer. Common bile duct: Diameter: 4 mm Liver: The liver demonstrates multiple mass lesions within. The largest of these lines in the right lobe measuring 3.6 cm in greatest dimension.  These are consistent with metastatic disease till proven otherwise. Continued workup is recommended. IMPRESSION: Changes consistent with hepatic metastatic disease. Further workup is recommended. Electronically Signed   By: Inez Catalina M.D.   On: 11/04/2016 18:20    ASSESSMENT: Widespread metastatic disease.  PLAN:    1. Widespread metastatic disease: CT scan results reviewed independently and reported as above with widespread lesions consistent with metastatic disease. Patient's AFP is normal, but the  remainder of her tumor markers are significantly elevated. We will get an ultrasound-guided biopsy of her liver to confirm the diagnosis. We will also get a PET scan and MRI the brain to complete the staging workup. Patient will return to clinic in approximately one week to discuss the results and treatment planning. 2. Elevated liver enzymes: Likely secondary to metastatic disease in patient's liver, monitor. 3. Hypercalcemia: Secondary to underlying malignancy. Patient will benefit from Zometa and IV fluids in the near future. 4. Right flank pain: Likely secondary to malignancy. Patient was given a referral to radiation oncology for palliative XRT.  Approximately 60 minutes was spent in discussion of which greater than 50% was consultation.  Patient expressed understanding and was in agreement with this plan. She also understands that She can call clinic at any time with any questions, concerns, or complaints.   Cancer Staging No matching staging information was found for the patient.  Lloyd Huger, MD   11/16/2016 7:30 AM

## 2016-11-18 ENCOUNTER — Ambulatory Visit
Admission: RE | Admit: 2016-11-18 | Discharge: 2016-11-18 | Disposition: A | Discharge status: Home / Self Care | Payer: 59 | Source: Ambulatory Visit | Attending: Oncology | Admitting: Oncology

## 2016-11-18 DIAGNOSIS — C7951 Secondary malignant neoplasm of bone: Secondary | ICD-10-CM | POA: Diagnosis not present

## 2016-11-18 DIAGNOSIS — Z808 Family history of malignant neoplasm of other organs or systems: Secondary | ICD-10-CM | POA: Insufficient documentation

## 2016-11-18 DIAGNOSIS — E785 Hyperlipidemia, unspecified: Secondary | ICD-10-CM | POA: Diagnosis not present

## 2016-11-18 DIAGNOSIS — I10 Essential (primary) hypertension: Secondary | ICD-10-CM | POA: Insufficient documentation

## 2016-11-18 DIAGNOSIS — Z79899 Other long term (current) drug therapy: Secondary | ICD-10-CM | POA: Insufficient documentation

## 2016-11-18 DIAGNOSIS — Z885 Allergy status to narcotic agent status: Secondary | ICD-10-CM | POA: Insufficient documentation

## 2016-11-18 DIAGNOSIS — Z880 Allergy status to penicillin: Secondary | ICD-10-CM | POA: Diagnosis not present

## 2016-11-18 DIAGNOSIS — C3490 Malignant neoplasm of unspecified part of unspecified bronchus or lung: Secondary | ICD-10-CM | POA: Diagnosis not present

## 2016-11-18 DIAGNOSIS — C799 Secondary malignant neoplasm of unspecified site: Secondary | ICD-10-CM

## 2016-11-18 DIAGNOSIS — Z882 Allergy status to sulfonamides status: Secondary | ICD-10-CM | POA: Diagnosis not present

## 2016-11-18 DIAGNOSIS — Z823 Family history of stroke: Secondary | ICD-10-CM | POA: Diagnosis not present

## 2016-11-18 DIAGNOSIS — K769 Liver disease, unspecified: Secondary | ICD-10-CM | POA: Diagnosis present

## 2016-11-18 DIAGNOSIS — I2510 Atherosclerotic heart disease of native coronary artery without angina pectoris: Secondary | ICD-10-CM | POA: Diagnosis not present

## 2016-11-18 DIAGNOSIS — C787 Secondary malignant neoplasm of liver and intrahepatic bile duct: Secondary | ICD-10-CM | POA: Insufficient documentation

## 2016-11-18 DIAGNOSIS — F17210 Nicotine dependence, cigarettes, uncomplicated: Secondary | ICD-10-CM | POA: Insufficient documentation

## 2016-11-18 DIAGNOSIS — Z8249 Family history of ischemic heart disease and other diseases of the circulatory system: Secondary | ICD-10-CM | POA: Diagnosis not present

## 2016-11-18 MED ORDER — ONDANSETRON HCL 4 MG/2ML IJ SOLN
4.0000 mg | INTRAMUSCULAR | Status: DC | PRN
  Administered 2016-11-18: 4 mg via INTRAVENOUS

## 2016-11-18 MED ORDER — MIDAZOLAM HCL 2 MG/2ML IJ SOLN
INTRAMUSCULAR | Status: AC | PRN
  Administered 2016-11-18: 1 mg via INTRAVENOUS

## 2016-11-18 MED ORDER — SODIUM CHLORIDE 0.9 % IV SOLN
INTRAVENOUS | Status: DC
  Administered 2016-11-18: 09:00:00 via INTRAVENOUS

## 2016-11-18 MED ORDER — FENTANYL CITRATE (PF) 100 MCG/2ML IJ SOLN
INTRAMUSCULAR | Status: AC | PRN
  Administered 2016-11-18: 50 ug via INTRAVENOUS

## 2016-11-18 MED ORDER — OXYCODONE-ACETAMINOPHEN 5-325 MG PO TABS
1.0000 | ORAL_TABLET | Freq: Once | ORAL | Status: AC
  Administered 2016-11-18: 1 via ORAL

## 2016-11-18 NOTE — H&P (Signed)
Chief Complaint: Patient was seen in consultation today for liver biopsy at the request of Finnegan,Timothy J  Referring Physician(s): Finnegan,Timothy J  Patient Status:  [facility]  History of Present Illness: Latasha [name] is a 61 y.o. female presenting with multiple liver lesions, right lung mass, right hilar adenopathy, LUL lung nodule and bone metastases.  Here for US guided liver biopsy to establish diagnosis.  Complaint of occasional abdominal pain.  Occasional cough without significant dyspnea.  Denies hemoptysis.  Past Medical History:  Diagnosis Date  . Coronary artery disease    Cardiac catheterization in January of 2015 showed mild nonobstructive three-vessel coronary artery disease with significant mid LAD spasm which improved with nitroglycerin. Metoprolol was switched to diltiazem  . Hyperlipidemia   . Hypertension   . PSVT (paroxysmal supraventricular tachycardia) ([place])     Past Surgical History:  Procedure Laterality Date  . CARDIAC CATHETERIZATION  06/2013   [facility]  . SUPRAVENTRICULAR TACHYCARDIA ABLATION N/A 04/22/2014   Procedure: SUPRAVENTRICULAR TACHYCARDIA ABLATION;  Surgeon: Evans Lance, MD;  Location: [facility] CATH LAB;  Service: Cardiovascular;  Laterality: N/A;  . TONSILLECTOMY    . TUBAL LIGATION      Allergies: Bee venom; Codeine; Penicillin g; and Sulfa antibiotics  Medications: Prior to Admission medications   Medication Sig Start Date End Date Taking? Authorizing Provider  albuterol (PROVENTIL HFA;VENTOLIN HFA) 108 (90 BASE) MCG/ACT inhaler Inhale 2 puffs into the lungs every 4 (four) hours as needed for wheezing or shortness of breath.   Yes [provider]  ALOE PO Take by mouth.   Yes [provider]  cetirizine (ZYRTEC) 10 MG tablet Take 10 mg by mouth daily.   Yes [provider]  ibuprofen (ADVIL,MOTRIN) 200 MG tablet Take 200 mg by mouth every 6 (six) hours as needed.   Yes [provider]    lisinopril (PRINIVIL,ZESTRIL) 5 MG tablet Take 5 mg by mouth daily.   Yes [provider]  traMADol (ULTRAM) 50 MG tablet Take 1 tablet (50 mg total) by mouth 3 (three) times daily as needed. Patient not taking: Reported on 11/18/2016 11/12/16   Lloyd Huger, MD     Family History  Problem Relation Age of Onset  . Stroke Mother   . Hypertension Mother   . Heart attack Father   . Thyroid cancer Sister   . Stroke Sister     Social History   Social History  . Marital status: Married    Spouse name: N/A  . Number of children: N/A  . Years of education: N/A   Social History Main Topics  . Smoking status: Current Some Day Smoker    Years: 30.00    Types: Cigarettes  . Smokeless tobacco: Never Used  . Alcohol use No  . Drug use: No  . Sexual activity: Not on file   Other Topics Concern  . Not on file   Social History Narrative  . No narrative on file    ECOG Status: 1 - Symptomatic but completely ambulatory  Review of Systems: A 12 point ROS discussed and pertinent positives are indicated in the HPI above.  All other systems are negative.  Review of Systems  Constitutional: Negative.   Respiratory: Positive for cough. Negative for apnea, choking, chest tightness, shortness of breath, wheezing and stridor.   Cardiovascular: Negative.   Gastrointestinal: Positive for abdominal pain. Negative for abdominal distention, blood in stool, constipation, diarrhea, nausea and vomiting.  Genitourinary: Negative.   Musculoskeletal:  Negative.   Neurological: Negative.     Vital Signs: BP 139/80 (BP Location: Left Arm)   Pulse (!) 106   Temp 98.1 F (36.7 C) (Oral)   Resp (!) 23   Ht 5\' 5"  (1.651 m)   Wt 146 lb (66.2 kg)   SpO2 93%   BMI 24.30 kg/m   Physical Exam  Constitutional: She is oriented to person, place, and time. No distress.  HENT:  Head: Normocephalic and atraumatic.  Neck: Neck supple. No JVD present. No tracheal deviation present.   Cardiovascular: Regular rhythm and normal heart sounds.  Exam reveals no gallop and no friction rub.   No murmur heard. Tachycardic  Pulmonary/Chest: Effort normal. No stridor. No respiratory distress. She has no wheezes.  Few bilateral crackles  Abdominal: Soft. Bowel sounds are normal. She exhibits no distension. There is no tenderness. There is no rebound and no guarding.  Musculoskeletal: She exhibits no edema.  Lymphadenopathy:    She has no cervical adenopathy.  Neurological: She is alert and oriented to person, place, and time.  Skin: She is not diaphoretic.  Vitals reviewed.   Mallampati Score:  MD Evaluation Airway: WNL Heart: WNL Abdomen: WNL Chest/ Lungs: Other (comments) Chest/ lungs comments: Few crackles bilaterally ASA  Classification: 3 Mallampati/Airway Score: One  Imaging: Ct Chest W Contrast  Result Date: 11/07/2016 CLINICAL DATA:  Pt states she's had RUQ/right side pain up under ribs x2wks, pain is sharp, feels like a baseball under ribs, nausea/vomiting/severe heartburn. Weight loss of 10 pounds in 1 week. Back pain for 2 days. History of smoking, cardiac ablation, tubal ligation, C-section. EXAM: CT CHEST, ABDOMEN, AND PELVIS WITH CONTRAST TECHNIQUE: Multidetector CT imaging of the chest, abdomen and pelvis was performed following the standard protocol during bolus administration of intravenous contrast. CONTRAST:  39mL ISOVUE-300 IOPAMIDOL (ISOVUE-300) INJECTION 61% COMPARISON:  Ultrasound of the abdomen on 11/04/2016 FINDINGS: CT CHEST FINDINGS Cardiovascular: Moderate coronary artery calcifications. The heart size is normal. There is thoracic aortic atherosclerosis. The aorta is not aneurysmal. Main pulmonary artery is upper limits normal in size. There is attenuation of the branches of the right pulmonary artery secondary to right hilar and mediastinal mass. See below. Lung interface of the mass with the right main pulmonary artery. Mediastinum/Nodes: Right  hilar mass is all contiguous with spiculated mass in the right upper lobe. Right hilar and mediastinal adenopathy is 5.0 x 3.9 cm, measured on coronal image number 61 of series 6. Enlarged nodes are identified in the pretracheal region (1.8 cm), subcarinal region (2.1 cm), and superior mediastinum (0.9 cm). Axillary regions are unremarkable. Esophagus is normal in appearance. Lungs/Pleura: Marked emphysematous changes are identified throughout the lungs, primarily involving the apices. Spiculated mass is identified within the right upper lobe, contiguous with adenopathy in the right hilar region. The spiculated mass is estimated to be 3.0 x 2.7 cm. Within the left upper lobe, there is a solid and spiculated mass which measures 1.3 x 1.7 cm. Lesion is suspicious for synchronous primary. A 5 mm nodule is identified in the left lower lobe, best seen on image 78 of series 5. There is marked narrowing and near complete occlusion of the right upper lobe bronchus by right hilar /right upper lobe mass. Long interface of the hilar mass with the trachea, right mainstem bronchus, bronchus intermedius, and lower lobe bronchus. Musculoskeletal: Numerous lytic lesions in soft tissue mass are identified within the right seventh rib. There are small lytic foci within the vertebral bodies T9, T10.  Additional: Somewhat nodular appearance to the breast tissue in the retroareolar regions bilaterally, right greater than left. Further evaluation is indicated. CT ABDOMEN PELVIS FINDINGS Hepatobiliary: The liver is enlarged by a numerous low-attenuation lesions. Lesions range in size from 5 mm to 3 cm. Portal vein is patent. Gallbladder is present and displaced into the pelvis by the enlarged liver. Pancreas: Unremarkable. No pancreatic ductal dilatation or surrounding inflammatory changes. Spleen: Normal in size without focal abnormality. Adrenals/Urinary Tract: Adrenal glands are unremarkable. Kidneys are normal, without renal calculi,  focal lesion, or hydronephrosis. Bladder is unremarkable. Stomach/Bowel: The stomach and small bowel loops are normal in appearance. Colon is normal in appearance. The appendix is well seen and has a normal appearance. Vascular/Lymphatic: There is significant atherosclerotic calcification of the abdominal aorta. Reproductive: The uterus is present.  No adnexal mass. Other: No free pelvic fluid. Musculoskeletal: Mild degenerative changes are seen in the lumbar spine. IMPRESSION: 1. 3.0 cm spiculated right upper lobe mass and significant right hilar and mediastinal adenopathy. Findings are compatible with malignancy. There is associated near occlusion of the right upper lobe bronchus. 2. Left upper lobe mass 1.7 cm suspicious for synchronous primary. 3. 5 mm nodule in the left lower lobe. 4. Marked hepatomegaly secondary to extensive metastatic disease. 5. Bilateral breast nodules, right greater than left. Bilateral diagnostic mammogram and bilateral breast ultrasound are indicated for further evaluation. 6. Normal adrenal glands. 7. Osseous metastatic disease including expansion and lytic lesion of the right seventh rib as well as vertebral bodies T9 and T10. 8. Coronary artery disease. 9. Aortic atherosclerosis. The salient findings were discussed with Barbaraann Boys on 11/07/2016 at 12:34 pm. Electronically Signed   By: Nolon Nations M.D.   On: 11/07/2016 12:35   Ct Abdomen Pelvis W Contrast  Result Date: 11/07/2016 CLINICAL DATA:  Pt states she's had RUQ/right side pain up under ribs x2wks, pain is sharp, feels like a baseball under ribs, nausea/vomiting/severe heartburn. Weight loss of 10 pounds in 1 week. Back pain for 2 days. History of smoking, cardiac ablation, tubal ligation, C-section. EXAM: CT CHEST, ABDOMEN, AND PELVIS WITH CONTRAST TECHNIQUE: Multidetector CT imaging of the chest, abdomen and pelvis was performed following the standard protocol during bolus administration of intravenous contrast.  CONTRAST:  41mL ISOVUE-300 IOPAMIDOL (ISOVUE-300) INJECTION 61% COMPARISON:  Ultrasound of the abdomen on 11/04/2016 FINDINGS: CT CHEST FINDINGS Cardiovascular: Moderate coronary artery calcifications. The heart size is normal. There is thoracic aortic atherosclerosis. The aorta is not aneurysmal. Main pulmonary artery is upper limits normal in size. There is attenuation of the branches of the right pulmonary artery secondary to right hilar and mediastinal mass. See below. Lung interface of the mass with the right main pulmonary artery. Mediastinum/Nodes: Right hilar mass is all contiguous with spiculated mass in the right upper lobe. Right hilar and mediastinal adenopathy is 5.0 x 3.9 cm, measured on coronal image number 61 of series 6. Enlarged nodes are identified in the pretracheal region (1.8 cm), subcarinal region (2.1 cm), and superior mediastinum (0.9 cm). Axillary regions are unremarkable. Esophagus is normal in appearance. Lungs/Pleura: Marked emphysematous changes are identified throughout the lungs, primarily involving the apices. Spiculated mass is identified within the right upper lobe, contiguous with adenopathy in the right hilar region. The spiculated mass is estimated to be 3.0 x 2.7 cm. Within the left upper lobe, there is a solid and spiculated mass which measures 1.3 x 1.7 cm. Lesion is suspicious for synchronous primary. A 5 mm nodule is identified in  the left lower lobe, best seen on image 78 of series 5. There is marked narrowing and near complete occlusion of the right upper lobe bronchus by right hilar /right upper lobe mass. Long interface of the hilar mass with the trachea, right mainstem bronchus, bronchus intermedius, and lower lobe bronchus. Musculoskeletal: Numerous lytic lesions in soft tissue mass are identified within the right seventh rib. There are small lytic foci within the vertebral bodies T9, T10. Additional: Somewhat nodular appearance to the breast tissue in the  retroareolar regions bilaterally, right greater than left. Further evaluation is indicated. CT ABDOMEN PELVIS FINDINGS Hepatobiliary: The liver is enlarged by a numerous low-attenuation lesions. Lesions range in size from 5 mm to 3 cm. Portal vein is patent. Gallbladder is present and displaced into the pelvis by the enlarged liver. Pancreas: Unremarkable. No pancreatic ductal dilatation or surrounding inflammatory changes. Spleen: Normal in size without focal abnormality. Adrenals/Urinary Tract: Adrenal glands are unremarkable. Kidneys are normal, without renal calculi, focal lesion, or hydronephrosis. Bladder is unremarkable. Stomach/Bowel: The stomach and small bowel loops are normal in appearance. Colon is normal in appearance. The appendix is well seen and has a normal appearance. Vascular/Lymphatic: There is significant atherosclerotic calcification of the abdominal aorta. Reproductive: The uterus is present.  No adnexal mass. Other: No free pelvic fluid. Musculoskeletal: Mild degenerative changes are seen in the lumbar spine. IMPRESSION: 1. 3.0 cm spiculated right upper lobe mass and significant right hilar and mediastinal adenopathy. Findings are compatible with malignancy. There is associated near occlusion of the right upper lobe bronchus. 2. Left upper lobe mass 1.7 cm suspicious for synchronous primary. 3. 5 mm nodule in the left lower lobe. 4. Marked hepatomegaly secondary to extensive metastatic disease. 5. Bilateral breast nodules, right greater than left. Bilateral diagnostic mammogram and bilateral breast ultrasound are indicated for further evaluation. 6. Normal adrenal glands. 7. Osseous metastatic disease including expansion and lytic lesion of the right seventh rib as well as vertebral bodies T9 and T10. 8. Coronary artery disease. 9. Aortic atherosclerosis. The salient findings were discussed with Barbaraann Boys on 11/07/2016 at 12:34 pm. Electronically Signed   By: Nolon Nations M.D.   On:  11/07/2016 12:35   US Abdomen Limited Ruq  Result Date: 11/04/2016 CLINICAL DATA:  Right upper quadrant pain EXAM: US ABDOMEN LIMITED - RIGHT UPPER QUADRANT COMPARISON:  07/31/2011 FINDINGS: Gallbladder: No gallstones or wall thickening visualized. No sonographic Murphy sign noted by sonographer. Common bile duct: Diameter: 4 mm Liver: The liver demonstrates multiple mass lesions within. The largest of these lines in the right lobe measuring 3.6 cm in greatest dimension. These are consistent with metastatic disease till proven otherwise. Continued workup is recommended. IMPRESSION: Changes consistent with hepatic metastatic disease. Further workup is recommended. Electronically Signed   By: Inez Catalina M.D.   On: 11/04/2016 18:20    Labs:  CBC:  Recent Labs  01/18/16 1042 11/12/16 1535  WBC 10.8 13.4*  HGB 14.3 14.6  HCT 41.1 42.9  PLT 210 326    COAGS:  Recent Labs  11/12/16 1535  INR 1.01  APTT 27    BMP:  Recent Labs  01/18/16 1042 11/12/16 1535  NA 132* 132*  K 4.2 4.4  CL 101 94*  CO2 23 27  GLUCOSE 118* 87  BUN 11 10  CALCIUM 9.5 12.0*  CREATININE 0.78 0.70  GFRNONAA >60 >60  GFRAA >60 >60    LIVER FUNCTION TESTS:  Recent Labs  01/18/16 1042 11/12/16 1535  BILITOT  0.5 1.5*  AST 27 146*  ALT 18 96*  ALKPHOS 63 547*  PROT 7.5 7.4  ALBUMIN 4.4 3.7    TUMOR MARKERS:  Recent Labs  11/12/16 1535  AFPTM 1.9  CEA 1,423.0*  CA199 6,555*    Assessment and Plan:  For ultrasound guided liver biopsy today to establish tissue diagnosis of probable stage 4 lung carcinoma.  Risks and Benefits discussed with the patient including, but not limited to bleeding, infection, damage to adjacent structures or low yield requiring additional tests. All of the patient's questions were answered, patient is agreeable to proceed. Consent signed and in chart.  Thank you for this interesting consult.  I greatly enjoyed [place] and look forward to  participating in their care.  A copy of this report was sent to the requesting provider on this date.  Electronically SignedAletta Edouard T 11/18/2016, 9:08 AM   I spent a total of 15 Minutes   in face to face in clinical consultation, greater than 50% of which was counseling/coordinating care for liver biopsy.

## 2016-11-18 NOTE — Procedures (Signed)
Interventional Radiology Procedure Note  Procedure: US guided core biopsy of liver   Complications: None  Estimated Blood Loss: None  Lesion in left lobe targeted for biopsy.  18 G core biopsy x 3 performed through 17 G needle.  Plan: 2 hour recovery  Latasha [name]. Kathlene Cote, M.D Pager:  [phone]

## 2016-11-18 NOTE — Discharge Instructions (Signed)
Needle Biopsy, Care After Refer to this sheet in the next few weeks. These instructions provide you with information about caring for yourself after your procedure. Your health care provider may also give you more specific instructions. Your treatment has been planned according to current medical practices, but problems sometimes occur. Call your health care provider if you have any problems or questions after your procedure. What can I expect after the procedure? After your procedure, it is common to have soreness, bruising, or mild pain at the biopsy site. This should go away in a few days. Follow these instructions at home:  Rest as directed by your health care provider.  No driving for next 24 hours  No alcohol intake for next 24 hours  Take medicines only as directed by your health care provider.  There are many different ways to close and cover the biopsy site, including stitches (sutures), skin glue, and adhesive strips. Follow your health care provider's instructions about: ? Biopsy site care. ? Bandage (dressing) changes and removal.   Check your biopsy site every day for signs of infection. Watch for: ? Redness, swelling, or pain. ? Fluid, blood, or pus. Contact a health care provider if:  You have a fever.  You have redness, swelling, or pain at the biopsy site that lasts longer than a few days.  You have fluid, blood, or pus coming from the biopsy site.  You feel nauseous.  You vomit. Get help right away if:  You have shortness of breath.  You have trouble breathing.  You have chest pain.  You feel dizzy or you faint.  You have bleeding that does not stop with pressure or a bandage.  You cough up blood.  You have pain in your abdomen. This information is not intended to replace advice given to you by your health care provider. Make sure you discuss any questions you have with your health care provider. Document Released: 10/18/2014 Document Revised:  11/09/2015 Document Reviewed: 05/30/2014 Elsevier Interactive Patient Education  [employer].

## 2016-11-18 NOTE — Progress Notes (Signed)
Patient remains clinically stable post procedure. bandade dressing intact/dry. Daughter at bedside. Dr Kathlene Cote out to see patient, stating 8/10 abdominal pain, patient states most pain meds cause vomiting, conferred with Dr Kathlene Cote regarding codeine not really allergy but vomiting. Percocet ok to give per md.giving zofran iv as well with pain pill

## 2016-11-18 NOTE — Progress Notes (Signed)
Denies complaints at this time, discharge teaching done with questions answered. Daughter at bedside. bandade dry/intact.

## 2016-11-19 ENCOUNTER — Ambulatory Visit
Admission: RE | Admit: 2016-11-19 | Discharge: 2016-11-19 | Disposition: A | Discharge status: Home / Self Care | Payer: 59 | Source: Ambulatory Visit | Attending: Radiation Oncology | Admitting: Radiation Oncology

## 2016-11-19 ENCOUNTER — Other Ambulatory Visit: Payer: Self-pay | Admitting: Pathology

## 2016-11-19 ENCOUNTER — Other Ambulatory Visit: Payer: Self-pay | Admitting: *Deleted

## 2016-11-19 ENCOUNTER — Encounter: Payer: Self-pay | Admitting: *Deleted

## 2016-11-19 ENCOUNTER — Encounter: Payer: Self-pay | Admitting: Radiation Oncology

## 2016-11-19 VITALS — BP 129/82 | HR 139 | Temp 99.3°F | Resp 20 | Wt 144.5 lb

## 2016-11-19 DIAGNOSIS — I471 Supraventricular tachycardia: Secondary | ICD-10-CM | POA: Insufficient documentation

## 2016-11-19 DIAGNOSIS — Z808 Family history of malignant neoplasm of other organs or systems: Secondary | ICD-10-CM | POA: Insufficient documentation

## 2016-11-19 DIAGNOSIS — C7B8 Other secondary neuroendocrine tumors: Secondary | ICD-10-CM | POA: Insufficient documentation

## 2016-11-19 DIAGNOSIS — C7A8 Other malignant neuroendocrine tumors: Secondary | ICD-10-CM | POA: Insufficient documentation

## 2016-11-19 DIAGNOSIS — Z510 Encounter for antineoplastic radiation therapy: Secondary | ICD-10-CM | POA: Insufficient documentation

## 2016-11-19 DIAGNOSIS — F17210 Nicotine dependence, cigarettes, uncomplicated: Secondary | ICD-10-CM | POA: Diagnosis not present

## 2016-11-19 DIAGNOSIS — C799 Secondary malignant neoplasm of unspecified site: Secondary | ICD-10-CM

## 2016-11-19 DIAGNOSIS — C7951 Secondary malignant neoplasm of bone: Secondary | ICD-10-CM

## 2016-11-19 DIAGNOSIS — R42 Dizziness and giddiness: Secondary | ICD-10-CM

## 2016-11-19 DIAGNOSIS — I2510 Atherosclerotic heart disease of native coronary artery without angina pectoris: Secondary | ICD-10-CM | POA: Diagnosis not present

## 2016-11-19 DIAGNOSIS — I10 Essential (primary) hypertension: Secondary | ICD-10-CM | POA: Diagnosis not present

## 2016-11-19 DIAGNOSIS — R109 Unspecified abdominal pain: Secondary | ICD-10-CM | POA: Insufficient documentation

## 2016-11-19 DIAGNOSIS — E785 Hyperlipidemia, unspecified: Secondary | ICD-10-CM | POA: Insufficient documentation

## 2016-11-19 DIAGNOSIS — Z79899 Other long term (current) drug therapy: Secondary | ICD-10-CM | POA: Diagnosis not present

## 2016-11-19 MED ORDER — FENTANYL 25 MCG/HR TD PT72: 25.0000 ug | MEDICATED_PATCH | TRANSDERMAL | 0 refills | Status: DC

## 2016-11-19 NOTE — Consult Note (Signed)
NEW PATIENT EVALUATION  Name: Latasha [name]  MRN: [id number]  Date:   11/19/2016     DOB: 1956-03-30   This 61 y.o. 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  REFERRING PHYSICIAN: Barbaraann Boys, MD  CHIEF COMPLAINT:  Chief Complaint  Patient presents with  . Cancer    Pt is here for initial consultation of bone metastasis.      DIAGNOSIS: The encounter diagnosis was Bone metastasis ([place]).   PREVIOUS INVESTIGATIONS:   CT scans reviewed MRI of brain and PET CT scan has been ordered Pathology pending Clinical notes reviewed  HPI: patient is a 61 year old female who presented with gradual increasing right flank pain. CT scan demonstrated widespread metastatic disease including liver bone and lung highly suspicious for underlying lung primary.She has just had a CT-guided biopsy of her liver. She is scheduled for MRI scan of her brain as well as PET/CT scan early next week. CT scan demonstrates metastatic disease to T9-10 as well as a right rib metastases metastasis. She is feeling somewhat swimmy headed.She is off her pain medication at that time and is in significant pain secondary to nausea. She specifically denies cough hemoptysis or chest tightness. I been asked to evaluate her for possible palliative radiation therapy to her thoracic spine.  PLANNED TREATMENT REGIMEN:  Palliative radiation therapy to thoracic spine  PAST MEDICAL HISTORY:  has a past medical history of Coronary artery disease; Hyperlipidemia; Hypertension; and PSVT (paroxysmal supraventricular tachycardia) ([place]).    PAST SURGICAL HISTORY:  Past Surgical History:  Procedure Laterality Date  . CARDIAC CATHETERIZATION  06/2013   [facility]  . SUPRAVENTRICULAR TACHYCARDIA ABLATION N/A 04/22/2014   Procedure: SUPRAVENTRICULAR TACHYCARDIA ABLATION;  Surgeon: Evans Lance, MD;  Location:  [facility] CATH LAB;  Service: Cardiovascular;  Laterality: N/A;  . TONSILLECTOMY    . TUBAL LIGATION      FAMILY HISTORY: family history includes Heart attack in her father; Hypertension in her mother; Stroke in her mother and sister; Thyroid cancer in her sister.  SOCIAL HISTORY:  reports that she has been smoking Cigarettes.  She has smoked for the past 30.00 years. She has never used smokeless tobacco. She reports that she does not drink alcohol or use drugs.  ALLERGIES: Bee venom; Codeine; Penicillin g; and Sulfa antibiotics  MEDICATIONS:  Current Outpatient Prescriptions  Medication Sig Dispense Refill  . albuterol (PROVENTIL HFA;VENTOLIN HFA) 108 (90 BASE) MCG/ACT inhaler Inhale 2 puffs into the lungs every 4 (four) hours as needed for wheezing or shortness of breath.    . ALOE PO Take by mouth.    . cetirizine (ZYRTEC) 10 MG tablet Take 10 mg by mouth daily.    Marland Kitchen ibuprofen (ADVIL,MOTRIN) 200 MG tablet Take 200 mg by mouth every 6 (six) hours as needed.    Marland Kitchen lisinopril (PRINIVIL,ZESTRIL) 5 MG tablet Take 5 mg by mouth daily.    . fentaNYL (DURAGESIC - DOSED MCG/HR) 25 MCG/HR patch Place 1 patch (25 mcg total) onto the skin every 3 (three) days. 10 patch 0  . traMADol (ULTRAM) 50 MG tablet Take 1 tablet (50 mg total) by mouth 3 (three) times daily as needed. (Patient not taking: Reported on 11/18/2016) 60 tablet 0   No current facility-administered medications for this encounter.     ECOG PERFORMANCE STATUS:  2 - Symptomatic, <50% confined to bed  REVIEW OF SYSTEMS:  Except for the nausea and fatigue  Patient denies any weight loss, fatigue, weakness, fever, chills or night sweats. Patient denies any loss of vision, blurred vision. Patient denies any ringing  of the ears or hearing loss. No irregular heartbeat. Patient denies heart murmur or history of fainting. Patient denies any chest pain or pain radiating to her upper extremities. Patient denies any shortness of breath, difficulty  breathing at night, cough or hemoptysis. Patient denies any swelling in the lower legs. Patient denies any nausea vomiting, vomiting of blood, or coffee ground material in the vomitus. Patient denies any stomach pain. Patient states has had normal bowel movements no significant constipation or diarrhea. Patient denies any dysuria, hematuria or significant nocturia. Patient denies any problems walking, swelling in the joints or loss of balance. Patient denies any skin changes, loss of hair or loss of weight. Patient denies any excessive worrying or anxiety or significant depression. Patient denies any problems with insomnia. Patient denies excessive thirst, polyuria, polydipsia. Patient denies any swollen glands, patient denies easy bruising or easy bleeding. Patient denies any recent infections, allergies or URI. Patient "s visual fields have not changed significantly in recent time.   PHYSICAL EXAM: BP 129/82   Pulse (!) 139   Temp 99.3 F (37.4 C)   Resp 20   Wt 144 lb 8.2 oz (65.5 kg)   BMI 24.05 kg/m   frail female wheelchair-bound in moderate pain discomfort. Motor sensory and DTR levels are equal and symmetric in the upper lower extremities. Well-developed well-nourished patient in NAD. HEENT reveals PERLA, EOMI, discs not visualized.  Oral cavity is clear. No oral mucosal lesions are identified. Neck is clear without evidence of cervical or supraclavicular adenopathy. Lungs are clear to A&P. Cardiac examination is essentially unremarkable with regular rate and rhythm without murmur rub or thrill. Abdomen is benign with no organomegaly or masses noted. Motor sensory and DTR levels are equal and symmetric in the upper and lower extremities. Cranial nerves II through XII are grossly intact. Proprioception is intact. No peripheral adenopathy or edema is identified. No motor or sensory levels are noted. Crude visual fields are within normal range.  LABORATORY DATA: Pathology report to be reviewed     RADIOLOGY RESULTS: CT scans reviewed MRI scan of brain and PET CT scan will be reviewed prior to simulation   IMPRESSION:  Widespread metastatic disease from probable primary lung cancer in 61 year old female  PLAN:  At this time like to review her MRI scan of her brain based on her symptoms she may certainly have brain metastasis. If that is the case will offer palliative radiation therapy to whole brain. At this time I have scheduled her for CT simulation for her thoracic spine with the intent to deliver3000 cGy in 10 fractions to T9 and T10. Risks and benefits of treatment including possible dysphasiafatigue alteration of blood counts skin reaction all were discussed in detail with the patient. I personally set up and ordered CT simulation shortly after her PET CT scan and MRI scan are performed. Also will evaluate her pathology report when that is available. Patient and family seem to comprehend my treatment plan well. I'm also asked medical oncology to start her on a fentanyl patch to help with pain control and avoid some of the GI toxicity.  I would like to take this opportunity to thank you for allowing me to participate in the care of your patient.Armstead Peaks., MD

## 2016-11-19 NOTE — Progress Notes (Signed)
  Oncology Nurse Navigator Documentation  Navigator Location: [facility] (11/19/16 1400)   )Navigator Encounter Type: Initial RadOnc (11/19/16 1400)                             Interventions: Coordination of Care (11/19/16 1400)   Coordination of Care: Radiology (11/19/16 1400)       met with patient during initial consultation with Dr. Baruch [name]. At time of visit, pt complained of pain and could not tolerate oral pain meds due to vomiting. Also, pt states that her dizziness has gotten worse since her last visit. Spoke with Dr. Grayland [name] about pt's complaints. Pt was given prescription for fentanyl patch while in clinic and instructions verbally given on how to use medication. Brain MRI was rescheduled to tomorrow morning at 9:30am. [name] stated will find a ride for the patient to get to her MRI. Informed pt's daughter that if unable to make appt for mri to let me know. [name], Latasha [name], verbalized understanding.            Time Spent with Patient: 60 (11/19/16 1400)

## 2016-11-20 ENCOUNTER — Ambulatory Visit
Admission: RE | Admit: 2016-11-20 | Discharge: 2016-11-20 | Disposition: A | Discharge status: Home / Self Care | Payer: 59 | Source: Ambulatory Visit | Attending: Oncology | Admitting: Oncology

## 2016-11-20 DIAGNOSIS — C799 Secondary malignant neoplasm of unspecified site: Secondary | ICD-10-CM | POA: Diagnosis present

## 2016-11-20 DIAGNOSIS — I6782 Cerebral ischemia: Secondary | ICD-10-CM | POA: Insufficient documentation

## 2016-11-20 DIAGNOSIS — R930 Abnormal findings on diagnostic imaging of skull and head, not elsewhere classified: Secondary | ICD-10-CM | POA: Insufficient documentation

## 2016-11-20 DIAGNOSIS — R42 Dizziness and giddiness: Secondary | ICD-10-CM

## 2016-11-20 MED ORDER — GADOBENATE DIMEGLUMINE 529 MG/ML IV SOLN
13.0000 mL | Freq: Once | INTRAVENOUS | Status: AC | PRN
  Administered 2016-11-20: 13 mL via INTRAVENOUS

## 2016-11-21 ENCOUNTER — Inpatient Hospital Stay: Payer: 59 | Attending: Oncology | Admitting: Oncology

## 2016-11-21 ENCOUNTER — Inpatient Hospital Stay: Payer: 59

## 2016-11-21 VITALS — BP 135/89 | HR 108 | Temp 97.9°F | Resp 20 | Wt 144.0 lb

## 2016-11-21 DIAGNOSIS — E785 Hyperlipidemia, unspecified: Secondary | ICD-10-CM | POA: Diagnosis not present

## 2016-11-21 DIAGNOSIS — R748 Abnormal levels of other serum enzymes: Secondary | ICD-10-CM | POA: Insufficient documentation

## 2016-11-21 DIAGNOSIS — K769 Liver disease, unspecified: Secondary | ICD-10-CM | POA: Diagnosis not present

## 2016-11-21 DIAGNOSIS — N6320 Unspecified lump in the left breast, unspecified quadrant: Secondary | ICD-10-CM | POA: Diagnosis not present

## 2016-11-21 DIAGNOSIS — R531 Weakness: Secondary | ICD-10-CM | POA: Insufficient documentation

## 2016-11-21 DIAGNOSIS — Z7689 Persons encountering health services in other specified circumstances: Secondary | ICD-10-CM | POA: Diagnosis not present

## 2016-11-21 DIAGNOSIS — I471 Supraventricular tachycardia: Secondary | ICD-10-CM | POA: Insufficient documentation

## 2016-11-21 DIAGNOSIS — I700 Atherosclerosis of aorta: Secondary | ICD-10-CM | POA: Insufficient documentation

## 2016-11-21 DIAGNOSIS — Z808 Family history of malignant neoplasm of other organs or systems: Secondary | ICD-10-CM | POA: Insufficient documentation

## 2016-11-21 DIAGNOSIS — R5383 Other fatigue: Secondary | ICD-10-CM | POA: Diagnosis not present

## 2016-11-21 DIAGNOSIS — C7A8 Other malignant neuroendocrine tumors: Secondary | ICD-10-CM | POA: Insufficient documentation

## 2016-11-21 DIAGNOSIS — Z7982 Long term (current) use of aspirin: Secondary | ICD-10-CM | POA: Insufficient documentation

## 2016-11-21 DIAGNOSIS — N6310 Unspecified lump in the right breast, unspecified quadrant: Secondary | ICD-10-CM | POA: Diagnosis not present

## 2016-11-21 DIAGNOSIS — I2510 Atherosclerotic heart disease of native coronary artery without angina pectoris: Secondary | ICD-10-CM | POA: Insufficient documentation

## 2016-11-21 DIAGNOSIS — R634 Abnormal weight loss: Secondary | ICD-10-CM | POA: Insufficient documentation

## 2016-11-21 DIAGNOSIS — F17210 Nicotine dependence, cigarettes, uncomplicated: Secondary | ICD-10-CM | POA: Diagnosis not present

## 2016-11-21 DIAGNOSIS — Z79899 Other long term (current) drug therapy: Secondary | ICD-10-CM | POA: Insufficient documentation

## 2016-11-21 DIAGNOSIS — E860 Dehydration: Secondary | ICD-10-CM | POA: Diagnosis not present

## 2016-11-21 DIAGNOSIS — I10 Essential (primary) hypertension: Secondary | ICD-10-CM | POA: Diagnosis not present

## 2016-11-21 DIAGNOSIS — K1379 Other lesions of oral mucosa: Secondary | ICD-10-CM | POA: Insufficient documentation

## 2016-11-21 DIAGNOSIS — R918 Other nonspecific abnormal finding of lung field: Secondary | ICD-10-CM | POA: Diagnosis not present

## 2016-11-21 DIAGNOSIS — Z5111 Encounter for antineoplastic chemotherapy: Secondary | ICD-10-CM | POA: Insufficient documentation

## 2016-11-21 DIAGNOSIS — R109 Unspecified abdominal pain: Secondary | ICD-10-CM

## 2016-11-21 DIAGNOSIS — R112 Nausea with vomiting, unspecified: Secondary | ICD-10-CM | POA: Diagnosis not present

## 2016-11-21 MED ORDER — SODIUM CHLORIDE 0.9 % IV SOLN
Freq: Once | INTRAVENOUS | Status: AC
  Administered 2016-11-21: 13:00:00 via INTRAVENOUS
  Filled 2016-11-21: qty 1000

## 2016-11-21 MED ORDER — ZOLEDRONIC ACID 4 MG/100ML IV SOLN
4.0000 mg | Freq: Once | INTRAVENOUS | Status: AC
  Administered 2016-11-21: 4 mg via INTRAVENOUS
  Filled 2016-11-21: qty 100

## 2016-11-21 NOTE — Progress Notes (Signed)
Patient denies any concerns today.  

## 2016-11-21 NOTE — Progress Notes (Signed)
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  INTERVAL HISTORY: Patient returns to clinic today for further evaluation, discussion of her pathology results, and treatment planning. Patient continues to have pain, but otherwise feels well. She has no neurologic complaints. She has a fair appetite, but denies further weight loss. She denies any recent fevers or illnesses. She denies any chest pain, shortness of breath, cough, or hemoptysis. She has no nausea, vomiting, constipation, or diarrhea. She has no urinary complaints. Patient otherwise feels well and offers no further specific complaints.  REVIEW OF SYSTEMS:   Review of Systems  Constitutional: Positive for malaise/fatigue and weight loss. Negative for fever.  Respiratory: Negative.  Negative for cough, hemoptysis and shortness of breath.   Cardiovascular: Negative.  Negative for chest pain and leg swelling.  Gastrointestinal: Negative.  Negative for abdominal pain, blood in stool and melena.  Genitourinary: Positive for flank pain.  Skin: Negative.   Neurological: Positive for weakness. Negative for sensory change.  Psychiatric/Behavioral: The patient is nervous/anxious.     As per HPI. Otherwise, a complete review of systems is negative.  PAST MEDICAL HISTORY: Past Medical History:  Diagnosis Date  . Coronary artery disease    Cardiac catheterization in January of 2015 showed mild nonobstructive three-vessel coronary artery disease with significant mid LAD spasm which improved with nitroglycerin. Metoprolol was switched to diltiazem  . Hyperlipidemia   . Hypertension   . PSVT (paroxysmal supraventricular tachycardia) ([place])      PAST SURGICAL HISTORY: Past Surgical History:  Procedure Laterality Date  . CARDIAC CATHETERIZATION  06/2013   [facility]  . SUPRAVENTRICULAR TACHYCARDIA ABLATION N/A 04/22/2014   Procedure: SUPRAVENTRICULAR TACHYCARDIA ABLATION;  Surgeon: Evans Lance, MD;  Location: [facility] CATH LAB;  Service: Cardiovascular;  Laterality: N/A;  . TONSILLECTOMY    . TUBAL LIGATION      FAMILY HISTORY: Family History  Problem Relation Age of Onset  . Stroke Mother   . Hypertension Mother   . Heart attack Father   . Thyroid cancer Sister   . Stroke Sister     ADVANCED DIRECTIVES (Y/N):  N  HEALTH MAINTENANCE: Social History  Substance Use Topics  . Smoking status: Current Some Day Smoker    Years: 30.00    Types: Cigarettes  . Smokeless tobacco: Never Used  . Alcohol use No     Colonoscopy:  PAP:  Bone density:  Lipid panel:  Allergies  Allergen Reactions  . Bee Venom Swelling  . Codeine Nausea And Vomiting    Pruritic rash  . Sulfa Antibiotics Other (See Comments)    Other reaction(s): Pruritic rash  . Penicillin G Rash    Has patient had a PCN reaction causing immediate rash, facial/tongue/throat swelling, SOB or lightheadedness with hypotension: Yes Has patient had a PCN reaction causing severe rash involving mucus membranes or skin necrosis: No Has patient had a PCN reaction that required hospitalization No Has patient had a PCN reaction occurring within the last 10 years: No If all of the above answers are "NO", then may proceed with Cephalosporin use.   . Shellfish Allergy Rash    Current Outpatient Prescriptions  Medication Sig Dispense Refill  . albuterol (PROVENTIL HFA;VENTOLIN HFA) 108 (90 BASE) MCG/ACT inhaler Inhale 2 puffs  into the lungs every 4 (four) hours as needed for wheezing or shortness of breath.    . cetirizine (ZYRTEC) 10 MG tablet Take 10 mg by mouth daily.    . fentaNYL (DURAGESIC - DOSED MCG/HR) 25 MCG/HR patch Place 1 patch (25 mcg total) onto the skin  every 3 (three) days. 10 patch 0  . traMADol (ULTRAM) 50 MG tablet Take 1 tablet (50 mg total) by mouth 3 (three) times daily as needed. (Patient not taking: Reported on 11/22/2016) 60 tablet 0  . aspirin EC 81 MG tablet Take 81 mg by mouth daily.    Marland Kitchen dimenhyDRINATE (DRAMAMINE) 50 MG tablet Take 50 mg by mouth daily as needed for nausea.    Marland Kitchen ibuprofen (ADVIL,MOTRIN) 200 MG tablet Take 200 mg by mouth 2 (two) times daily as needed for moderate pain.    Marland Kitchen lidocaine (LIDODERM) 5 % Place 1 patch onto the skin daily. Remove & Discard patch within 12 hours or as directed by MD 30 patch 0  . lisinopril (PRINIVIL,ZESTRIL) 10 MG tablet Take 10 mg by mouth daily.     No current facility-administered medications for this visit.    Facility-Administered Medications Ordered in Other Visits  Medication Dose Route Frequency Provider Last Rate Last Dose  . clindamycin (CLEOCIN) IVPB 300 mg  300 mg Intravenous Once Stegmayer, Kimberly A, PA-C        OBJECTIVE: Vitals:   11/21/16 1124  BP: 135/89  Pulse: (!) 108  Resp: 20  Temp: 97.9 F (36.6 C)     Body mass index is 23.96 kg/m.    ECOG FS:1 - Symptomatic but completely ambulatory  General: Well-developed, well-nourished, no acute distress. Eyes: Pink conjunctiva, anicteric sclera. Lungs: Clear to auscultation bilaterally. Heart: Regular rate and rhythm. No rubs, murmurs, or gallops. Abdomen: Soft, nontender, nondistended. No organomegaly noted, normoactive bowel sounds. Musculoskeletal: No edema, cyanosis, or clubbing. Neuro: Alert, answering all questions appropriately. Cranial nerves grossly intact. Skin: No rashes or petechiae noted. Psych: Normal affect.   LAB RESULTS:  Lab Results  Component Value Date   NA 130 (L) 11/25/2016   K 4.3 11/25/2016   CL 96 (L) 11/25/2016   CO2 22 11/25/2016   GLUCOSE 104 (H) 11/25/2016   BUN 22 (H) 11/25/2016   CREATININE 1.09 (H) 11/25/2016   CALCIUM 11.5 (H) 11/25/2016   PROT 6.7 11/25/2016    ALBUMIN 3.0 (L) 11/25/2016   AST 169 (H) 11/25/2016   ALT 66 (H) 11/25/2016   ALKPHOS 549 (H) 11/25/2016   BILITOT 2.1 (H) 11/25/2016   GFRNONAA 54 (L) 11/25/2016   GFRAA >60 11/25/2016    Lab Results  Component Value Date   WBC 13.5 (H) 11/25/2016   NEUTROABS 10.3 (H) 11/25/2016   HGB 15.0 11/25/2016   HCT 44.5 11/25/2016   MCV 94.7 11/25/2016   PLT 269 11/25/2016     STUDIES: Ct Chest W Contrast  Result Date: 11/07/2016 CLINICAL DATA:  Pt states she's had RUQ/right side pain up under ribs x2wks, pain is sharp, feels like a baseball under ribs, nausea/vomiting/severe heartburn. Weight loss of 10 pounds in 1 week. Back pain for 2 days. History of smoking, cardiac ablation, tubal ligation, C-section. EXAM: CT CHEST, ABDOMEN, AND PELVIS WITH CONTRAST TECHNIQUE: Multidetector CT imaging of the chest, abdomen and pelvis was performed following the standard protocol during bolus administration of intravenous contrast. CONTRAST:  11mL ISOVUE-300 IOPAMIDOL (ISOVUE-300) INJECTION 61% COMPARISON:  Ultrasound of the abdomen on 11/04/2016 FINDINGS: CT CHEST FINDINGS Cardiovascular: Moderate  coronary artery calcifications. The heart size is normal. There is thoracic aortic atherosclerosis. The aorta is not aneurysmal. Main pulmonary artery is upper limits normal in size. There is attenuation of the branches of the right pulmonary artery secondary to right hilar and mediastinal mass. See below. Lung interface of the mass with the right main pulmonary artery. Mediastinum/Nodes: Right hilar mass is all contiguous with spiculated mass in the right upper lobe. Right hilar and mediastinal adenopathy is 5.0 x 3.9 cm, measured on coronal image number 61 of series 6. Enlarged nodes are identified in the pretracheal region (1.8 cm), subcarinal region (2.1 cm), and superior mediastinum (0.9 cm). Axillary regions are unremarkable. Esophagus is normal in appearance. Lungs/Pleura: Marked emphysematous changes are  identified throughout the lungs, primarily involving the apices. Spiculated mass is identified within the right upper lobe, contiguous with adenopathy in the right hilar region. The spiculated mass is estimated to be 3.0 x 2.7 cm. Within the left upper lobe, there is a solid and spiculated mass which measures 1.3 x 1.7 cm. Lesion is suspicious for synchronous primary. A 5 mm nodule is identified in the left lower lobe, best seen on image 78 of series 5. There is marked narrowing and near complete occlusion of the right upper lobe bronchus by right hilar /right upper lobe mass. Long interface of the hilar mass with the trachea, right mainstem bronchus, bronchus intermedius, and lower lobe bronchus. Musculoskeletal: Numerous lytic lesions in soft tissue mass are identified within the right seventh rib. There are small lytic foci within the vertebral bodies T9, T10. Additional: Somewhat nodular appearance to the breast tissue in the retroareolar regions bilaterally, right greater than left. Further evaluation is indicated. CT ABDOMEN PELVIS FINDINGS Hepatobiliary: The liver is enlarged by a numerous low-attenuation lesions. Lesions range in size from 5 mm to 3 cm. Portal vein is patent. Gallbladder is present and displaced into the pelvis by the enlarged liver. Pancreas: Unremarkable. No pancreatic ductal dilatation or surrounding inflammatory changes. Spleen: Normal in size without focal abnormality. Adrenals/Urinary Tract: Adrenal glands are unremarkable. Kidneys are normal, without renal calculi, focal lesion, or hydronephrosis. Bladder is unremarkable. Stomach/Bowel: The stomach and small bowel loops are normal in appearance. Colon is normal in appearance. The appendix is well seen and has a normal appearance. Vascular/Lymphatic: There is significant atherosclerotic calcification of the abdominal aorta. Reproductive: The uterus is present.  No adnexal mass. Other: No free pelvic fluid. Musculoskeletal: Mild  degenerative changes are seen in the lumbar spine. IMPRESSION: 1. 3.0 cm spiculated right upper lobe mass and significant right hilar and mediastinal adenopathy. Findings are compatible with malignancy. There is associated near occlusion of the right upper lobe bronchus. 2. Left upper lobe mass 1.7 cm suspicious for synchronous primary. 3. 5 mm nodule in the left lower lobe. 4. Marked hepatomegaly secondary to extensive metastatic disease. 5. Bilateral breast nodules, right greater than left. Bilateral diagnostic mammogram and bilateral breast ultrasound are indicated for further evaluation. 6. Normal adrenal glands. 7. Osseous metastatic disease including expansion and lytic lesion of the right seventh rib as well as vertebral bodies T9 and T10. 8. Coronary artery disease. 9. Aortic atherosclerosis. The salient findings were discussed with Latasha [name] on 11/07/2016 at 12:34 pm. Electronically Signed   By: Nolon Nations M.D.   On: 11/07/2016 12:35   Mr Jeri Cos MH Contrast  Result Date: 11/20/2016 CLINICAL DATA:  Headache. Dizziness. Balance disturbance. Weakness. Metastatic lung cancer. EXAM: MRI HEAD WITHOUT AND WITH CONTRAST TECHNIQUE: Multiplanar, multiecho  pulse sequences of the brain and surrounding structures were obtained without and with intravenous contrast. CONTRAST:  66mL MULTIHANCE GADOBENATE DIMEGLUMINE 529 MG/ML IV SOLN COMPARISON:  None. FINDINGS: Brain: Diffusion imaging does not show any acute or subacute infarction. There are chronic small-vessel ischemic changes affecting the pons and cerebral hemispheric white matter, moderate in degree and premature for age. No sign of primary or metastatic intracranial mass lesion, hemorrhage, hydrocephalus or extra-axial collection. No abnormal intracranial enhancement. Vascular: Major vessels at the base of the brain show flow. Skull and upper cervical spine: Indeterminate 8 mm marrow focus in the right inferior posterior parietal region. This could  possibly be a bone metastasis but is nonspecific. Sinuses/Orbits: Clear/normal Other: None IMPRESSION: No evidence of intracranial metastatic disease or acute intracranial finding. Moderate chronic small-vessel ischemic changes of the brain, premature for age. 8 mm nonspecific focus within the right posterior inferior parietal calvarium. This could possibly be an early bone metastasis. Electronically Signed   By: Nelson Chimes M.D.   On: 11/20/2016 10:25   Ct Abdomen Pelvis W Contrast  Result Date: 11/07/2016 CLINICAL DATA:  Pt states she's had RUQ/right side pain up under ribs x2wks, pain is sharp, feels like a baseball under ribs, nausea/vomiting/severe heartburn. Weight loss of 10 pounds in 1 week. Back pain for 2 days. History of smoking, cardiac ablation, tubal ligation, C-section. EXAM: CT CHEST, ABDOMEN, AND PELVIS WITH CONTRAST TECHNIQUE: Multidetector CT imaging of the chest, abdomen and pelvis was performed following the standard protocol during bolus administration of intravenous contrast. CONTRAST:  80mL ISOVUE-300 IOPAMIDOL (ISOVUE-300) INJECTION 61% COMPARISON:  Ultrasound of the abdomen on 11/04/2016 FINDINGS: CT CHEST FINDINGS Cardiovascular: Moderate coronary artery calcifications. The heart size is normal. There is thoracic aortic atherosclerosis. The aorta is not aneurysmal. Main pulmonary artery is upper limits normal in size. There is attenuation of the branches of the right pulmonary artery secondary to right hilar and mediastinal mass. See below. Lung interface of the mass with the right main pulmonary artery. Mediastinum/Nodes: Right hilar mass is all contiguous with spiculated mass in the right upper lobe. Right hilar and mediastinal adenopathy is 5.0 x 3.9 cm, measured on coronal image number 61 of series 6. Enlarged nodes are identified in the pretracheal region (1.8 cm), subcarinal region (2.1 cm), and superior mediastinum (0.9 cm). Axillary regions are unremarkable. Esophagus is  normal in appearance. Lungs/Pleura: Marked emphysematous changes are identified throughout the lungs, primarily involving the apices. Spiculated mass is identified within the right upper lobe, contiguous with adenopathy in the right hilar region. The spiculated mass is estimated to be 3.0 x 2.7 cm. Within the left upper lobe, there is a solid and spiculated mass which measures 1.3 x 1.7 cm. Lesion is suspicious for synchronous primary. A 5 mm nodule is identified in the left lower lobe, best seen on image 78 of series 5. There is marked narrowing and near complete occlusion of the right upper lobe bronchus by right hilar /right upper lobe mass. Long interface of the hilar mass with the trachea, right mainstem bronchus, bronchus intermedius, and lower lobe bronchus. Musculoskeletal: Numerous lytic lesions in soft tissue mass are identified within the right seventh rib. There are small lytic foci within the vertebral bodies T9, T10. Additional: Somewhat nodular appearance to the breast tissue in the retroareolar regions bilaterally, right greater than left. Further evaluation is indicated. CT ABDOMEN PELVIS FINDINGS Hepatobiliary: The liver is enlarged by a numerous low-attenuation lesions. Lesions range in size from 5 mm to 3  cm. Portal vein is patent. Gallbladder is present and displaced into the pelvis by the enlarged liver. Pancreas: Unremarkable. No pancreatic ductal dilatation or surrounding inflammatory changes. Spleen: Normal in size without focal abnormality. Adrenals/Urinary Tract: Adrenal glands are unremarkable. Kidneys are normal, without renal calculi, focal lesion, or hydronephrosis. Bladder is unremarkable. Stomach/Bowel: The stomach and small bowel loops are normal in appearance. Colon is normal in appearance. The appendix is well seen and has a normal appearance. Vascular/Lymphatic: There is significant atherosclerotic calcification of the abdominal aorta. Reproductive: The uterus is present.  No  adnexal mass. Other: No free pelvic fluid. Musculoskeletal: Mild degenerative changes are seen in the lumbar spine. IMPRESSION: 1. 3.0 cm spiculated right upper lobe mass and significant right hilar and mediastinal adenopathy. Findings are compatible with malignancy. There is associated near occlusion of the right upper lobe bronchus. 2. Left upper lobe mass 1.7 cm suspicious for synchronous primary. 3. 5 mm nodule in the left lower lobe. 4. Marked hepatomegaly secondary to extensive metastatic disease. 5. Bilateral breast nodules, right greater than left. Bilateral diagnostic mammogram and bilateral breast ultrasound are indicated for further evaluation. 6. Normal adrenal glands. 7. Osseous metastatic disease including expansion and lytic lesion of the right seventh rib as well as vertebral bodies T9 and T10. 8. Coronary artery disease. 9. Aortic atherosclerosis. The salient findings were discussed with Latasha [name] on 11/07/2016 at 12:34 pm. Electronically Signed   By: Nolon Nations M.D.   On: 11/07/2016 12:35   Nm Pet Image Initial (pi) Skull Base To Thigh  Result Date: 11/25/2016 CLINICAL DATA:  Initial treatment strategy for large cell neuroendocrine carcinoma. EXAM: NUCLEAR MEDICINE PET SKULL BASE TO THIGH TECHNIQUE: 12.6 mCi F-18 FDG was injected intravenously. Full-ring PET imaging was performed from the skull base to thigh after the radiotracer. CT data was obtained and used for attenuation correction and anatomic localization. FASTING BLOOD GLUCOSE:  Value: 86 mg/dl COMPARISON:  CT chest abdomen pelvis 11/07/2016. FINDINGS: NECK No hypermetabolic lymph nodes in the neck. CT images show no acute findings. CHEST There are hypermetabolic low right internal jugular, bilateral mediastinal and right hilar lymph nodes. Index right paratracheal lymph node measures 1.5 cm with an SUV max of 11.9. Perihilar right middle lobe spiculated mass measures approximately 3.2 cm with an SUV max of 3.8. 1.5 cm lingular  nodule is hypermetabolic as well. Atherosclerotic calcification of the arterial vasculature, including coronary arteries. No pericardial or pleural effusion. ABDOMEN/PELVIS Liver is completely replaced with ill-defined low-attenuation lesions and is diffusely hypermetabolic with an index SUV max of 17.7. There are hypermetabolic periportal lymph nodes as well. No abnormal hypermetabolism in the adrenal glands, spleen or pancreas. No additional hypermetabolic lymph nodes. Liver is markedly enlarged. Gallbladder, adrenal glands, kidneys and spleen are grossly unremarkable. Calcifications are seen in the pancreas. Stomach and small bowel are grossly unremarkable. Small pelvic free fluid. SKELETON Multiple hypermetabolic lesions are seen throughout the visualized osseous structures. Index hypermetabolic lesion in the central sacrum has an SUV max of 6.6. IMPRESSION: 1. Hypermetabolic right middle lobe mass and lingular nodule with hypermetabolic low internal jugular/mediastinal/right hilar adenopathy, diffuse hypermetabolic involvement of the liver, periportal adenopathy and multiple hypermetabolic osseous lesions, most consistent with stage IV primary bronchogenic carcinoma (large-cell neuroendocrine carcinoma by biopsy). 2. Small pelvic free fluid. 3. Aortic atherosclerosis (ICD10-170.0). Coronary artery calcification. 4. Chronic calcific pancreatitis. Electronically Signed   By: Lorin Picket M.D.   On: 11/25/2016 10:37   US Biopsy  Result Date: 11/18/2016 CLINICAL DATA:  Bilateral pulmonary masses, skeletal metastatic disease and widespread metastatic disease throughout the liver. Referral for liver biopsy to establish tissue diagnosis. EXAM: ULTRASOUND GUIDED CORE BIOPSY OF LIVER MEDICATIONS: 2.0 mg IV Versed; 100 mcg IV Fentanyl Total Moderate Sedation Time: 18 minutes. The patient's level of consciousness and physiologic status were continuously monitored during the procedure by Radiology nursing. PROCEDURE:  The procedure, risks, benefits, and alternatives were explained to the patient. Questions regarding the procedure were encouraged and answered. The patient understands and consents to the procedure. A time out was performed prior to initiating the procedure. Ultrasound was used to localize liver lesions. The abdominal wall was prepped with chlorhexidine in a sterile fashion, and a sterile drape was applied covering the operative field. A sterile gown and sterile gloves were used for the procedure. Local anesthesia was provided with 1% Lidocaine. Under direct ultrasound guidance, a 17 gauge needle was advanced into the left lobe of the liver. After confirming needle tip position, coaxial 18 gauge core biopsy samples were performed with 3 samples obtained and submitted in formalin. Additional ultrasound was performed after outer needle removal. COMPLICATIONS: None. FINDINGS: Multitude of masses are seen throughout the liver parenchyma which are relatively hyperechoic relative to adjacent normal parenchyma. The left lobe was chosen for biopsy and a specific lesion measuring approximately 2.9 cm in greatest diameter was targeted. Solid tissue was obtained. IMPRESSION: Ultrasound-guided core biopsy performed within the left lobe of the liver at the level of a lesion. The liver is diffusely involved with a multitude of metastatic lesions. Electronically Signed   By: Aletta Edouard M.D.   On: 11/18/2016 12:50   US Abdomen Limited Ruq  Result Date: 11/04/2016 CLINICAL DATA:  Right upper quadrant pain EXAM: US ABDOMEN LIMITED - RIGHT UPPER QUADRANT COMPARISON:  07/31/2011 FINDINGS: Gallbladder: No gallstones or wall thickening visualized. No sonographic Murphy sign noted by sonographer. Common bile duct: Diameter: 4 mm Liver: The liver demonstrates multiple mass lesions within. The largest of these lines in the right lobe measuring 3.6 cm in greatest dimension. These are consistent with metastatic disease till proven  otherwise. Continued workup is recommended. IMPRESSION: Changes consistent with hepatic metastatic disease. Further workup is recommended. Electronically Signed   By: Inez Catalina M.D.   On: 11/04/2016 18:20    ASSESSMENT: Stage IV large cell neuroendocrine carcinoma.  PLAN:    1. Stage IV large cell neuroendocrine carcinoma: PET scan results reviewed independently and reported as above with widespread lesions consistent with metastatic disease. Patient's AFP is normal, but the remainder of her tumor markers are significantly elevated. MRI of the brain is negative for metastatic disease. After lengthy discussion with the patient, she wishes to pursue aggressive treatment with chemotherapy using cisplatin and etoposide. Patient will also receive Neulasta support. We will do 4 cycles every 3 weeks and then reimage. Return to clinic on December 02, 2016 to receive cycle 1, day 1 of cisplatin and etoposide. 2. Elevated liver enzymes: Likely secondary to metastatic disease in patient's liver, monitor. 3. Hypercalcemia: Secondary to underlying malignancy. Patient will receive IV Zometa today  4. Right flank pain: Likely secondary to malignancy. Patient will be receiving palliative XRT in the near future.   Approximately 30 minutes was spent in discussion of which greater than 50% was consultation.  Patient expressed understanding and was in agreement with this plan. She also understands that She can call clinic at any time with any questions, concerns, or complaints.   Cancer Staging No matching staging information was  found for the patient.  Lloyd Huger, MD   11/27/2016 8:48 AM

## 2016-11-22 LAB — SURGICAL PATHOLOGY

## 2016-11-25 ENCOUNTER — Inpatient Hospital Stay: Payer: 59

## 2016-11-25 ENCOUNTER — Ambulatory Visit
Admission: RE | Admit: 2016-11-25 | Discharge: 2016-11-25 | Disposition: A | Discharge status: Home / Self Care | Payer: 59 | Source: Ambulatory Visit | Attending: Oncology | Admitting: Oncology

## 2016-11-25 ENCOUNTER — Other Ambulatory Visit (INDEPENDENT_AMBULATORY_CARE_PROVIDER_SITE_OTHER): Payer: Self-pay | Admitting: Vascular Surgery

## 2016-11-25 ENCOUNTER — Other Ambulatory Visit: Payer: Self-pay

## 2016-11-25 ENCOUNTER — Other Ambulatory Visit: Payer: 59

## 2016-11-25 ENCOUNTER — Telehealth: Payer: Self-pay | Admitting: *Deleted

## 2016-11-25 VITALS — BP 117/79 | HR 102

## 2016-11-25 DIAGNOSIS — K861 Other chronic pancreatitis: Secondary | ICD-10-CM | POA: Insufficient documentation

## 2016-11-25 DIAGNOSIS — R160 Hepatomegaly, not elsewhere classified: Secondary | ICD-10-CM | POA: Insufficient documentation

## 2016-11-25 DIAGNOSIS — I2510 Atherosclerotic heart disease of native coronary artery without angina pectoris: Secondary | ICD-10-CM | POA: Insufficient documentation

## 2016-11-25 DIAGNOSIS — C7A8 Other malignant neuroendocrine tumors: Secondary | ICD-10-CM | POA: Diagnosis not present

## 2016-11-25 DIAGNOSIS — C799 Secondary malignant neoplasm of unspecified site: Secondary | ICD-10-CM | POA: Diagnosis not present

## 2016-11-25 DIAGNOSIS — I700 Atherosclerosis of aorta: Secondary | ICD-10-CM | POA: Insufficient documentation

## 2016-11-25 LAB — CBC WITH DIFFERENTIAL/PLATELET
Basophils Absolute: 0.2 10*3/uL — ABNORMAL HIGH (ref 0–0.1)
Basophils Relative: 1 %
Eosinophils Absolute: 0.2 10*3/uL (ref 0–0.7)
Eosinophils Relative: 1 %
HCT: 44.5 % (ref 35.0–47.0)
Hemoglobin: 15 g/dL (ref 12.0–16.0)
Lymphocytes Relative: 12 %
Lymphs Abs: 1.6 10*3/uL (ref 1.0–3.6)
MCH: 32 pg (ref 26.0–34.0)
MCHC: 33.8 g/dL (ref 32.0–36.0)
MCV: 94.7 fL (ref 80.0–100.0)
Monocytes Absolute: 1.4 10*3/uL — ABNORMAL HIGH (ref 0.2–0.9)
Monocytes Relative: 10 %
Neutro Abs: 10.3 10*3/uL — ABNORMAL HIGH (ref 1.4–6.5)
Neutrophils Relative %: 76 %
Platelets: 269 10*3/uL (ref 150–440)
RBC: 4.7 MIL/uL (ref 3.80–5.20)
RDW: 14.7 % — ABNORMAL HIGH (ref 11.5–14.5)
WBC: 13.5 10*3/uL — ABNORMAL HIGH (ref 3.6–11.0)

## 2016-11-25 LAB — COMPREHENSIVE METABOLIC PANEL
ALT: 66 U/L — ABNORMAL HIGH (ref 14–54)
AST: 169 U/L — ABNORMAL HIGH (ref 15–41)
Albumin: 3 g/dL — ABNORMAL LOW (ref 3.5–5.0)
Alkaline Phosphatase: 549 U/L — ABNORMAL HIGH (ref 38–126)
Anion gap: 12 (ref 5–15)
BUN: 22 mg/dL — ABNORMAL HIGH (ref 6–20)
CO2: 22 mmol/L (ref 22–32)
Calcium: 11.5 mg/dL — ABNORMAL HIGH (ref 8.9–10.3)
Chloride: 96 mmol/L — ABNORMAL LOW (ref 101–111)
Creatinine, Ser: 1.09 mg/dL — ABNORMAL HIGH (ref 0.44–1.00)
GFR calc Af Amer: >60 mL/min (ref >60)
GFR calc non Af Amer: 54 mL/min — ABNORMAL LOW (ref >60)
Glucose, Bld: 104 mg/dL — ABNORMAL HIGH (ref 65–99)
Potassium: 4.3 mmol/L (ref 3.5–5.1)
Sodium: 130 mmol/L — ABNORMAL LOW (ref 135–145)
Total Bilirubin: 2.1 mg/dL — ABNORMAL HIGH (ref 0.3–1.2)
Total Protein: 6.7 g/dL (ref 6.5–8.1)

## 2016-11-25 LAB — GLUCOSE, CAPILLARY: Glucose-Capillary: 86 mg/dL (ref 65–99)

## 2016-11-25 MED ORDER — LIDOCAINE 5 % EX PTCH: 1.0000 | MEDICATED_PATCH | CUTANEOUS | 0 refills | Status: DC

## 2016-11-25 MED ORDER — FLUDEOXYGLUCOSE F - 18 (FDG) INJECTION
12.5500 | Freq: Once | INTRAVENOUS | Status: AC | PRN
  Administered 2016-11-25: 12.55 via INTRAVENOUS

## 2016-11-25 MED ORDER — SODIUM CHLORIDE 0.9 % IV SOLN
Freq: Once | INTRAVENOUS | Status: AC
  Administered 2016-11-25: 12:00:00 via INTRAVENOUS
  Filled 2016-11-25: qty 1000

## 2016-11-25 NOTE — Telephone Encounter (Signed)
Spoke with pt's daughter who is concerned regarding pt's dizziness, increased back pain, and hallucinations. Latasha [name] states that dizziness has slightly improved over the weekend but is causing the pt to have frequent falls. Also, pt continues to have back pain and has developed hallucinations over the weekend. Per Dr. Mike Gip, pt will need lab checked today, check orthostatics, and receive IVF. May prescribe lidoderm patch for back pain.   Latasha [name] informed of appt scheduled for 11:15am today for labs and 11:30am for IVF. lidoderm patch escribed to pharmacy. Latasha [name] verbalized understanding.

## 2016-11-26 MED ORDER — CLINDAMYCIN PHOSPHATE 300 MG/50ML IV SOLN
300.0000 mg | Freq: Once | INTRAVENOUS | Status: AC
  Administered 2016-11-27: 300 mg via INTRAVENOUS

## 2016-11-27 ENCOUNTER — Encounter
Admission: RE | Disposition: A | Discharge status: Home / Self Care | Payer: Self-pay | Source: Ambulatory Visit | Attending: Vascular Surgery

## 2016-11-27 ENCOUNTER — Ambulatory Visit: Payer: 59 | Admitting: Oncology

## 2016-11-27 ENCOUNTER — Ambulatory Visit
Admission: RE | Admit: 2016-11-27 | Discharge: 2016-11-27 | Disposition: A | Discharge status: Home / Self Care | Payer: 59 | Source: Ambulatory Visit | Attending: Radiation Oncology | Admitting: Radiation Oncology

## 2016-11-27 ENCOUNTER — Ambulatory Visit
Admission: RE | Admit: 2016-11-27 | Discharge: 2016-11-27 | Disposition: A | Discharge status: Home / Self Care | Payer: 59 | Source: Ambulatory Visit | Attending: Vascular Surgery | Admitting: Vascular Surgery

## 2016-11-27 ENCOUNTER — Encounter: Payer: Self-pay | Admitting: *Deleted

## 2016-11-27 DIAGNOSIS — Z9889 Other specified postprocedural states: Secondary | ICD-10-CM | POA: Diagnosis not present

## 2016-11-27 DIAGNOSIS — I10 Essential (primary) hypertension: Secondary | ICD-10-CM | POA: Diagnosis not present

## 2016-11-27 DIAGNOSIS — Z91030 Bee allergy status: Secondary | ICD-10-CM | POA: Diagnosis not present

## 2016-11-27 DIAGNOSIS — Z8249 Family history of ischemic heart disease and other diseases of the circulatory system: Secondary | ICD-10-CM | POA: Insufficient documentation

## 2016-11-27 DIAGNOSIS — Z91013 Allergy to seafood: Secondary | ICD-10-CM | POA: Diagnosis not present

## 2016-11-27 DIAGNOSIS — C259 Malignant neoplasm of pancreas, unspecified: Secondary | ICD-10-CM | POA: Diagnosis not present

## 2016-11-27 DIAGNOSIS — Z885 Allergy status to narcotic agent status: Secondary | ICD-10-CM | POA: Insufficient documentation

## 2016-11-27 DIAGNOSIS — E785 Hyperlipidemia, unspecified: Secondary | ICD-10-CM | POA: Insufficient documentation

## 2016-11-27 DIAGNOSIS — F17200 Nicotine dependence, unspecified, uncomplicated: Secondary | ICD-10-CM | POA: Diagnosis not present

## 2016-11-27 DIAGNOSIS — Z808 Family history of malignant neoplasm of other organs or systems: Secondary | ICD-10-CM | POA: Insufficient documentation

## 2016-11-27 DIAGNOSIS — Z823 Family history of stroke: Secondary | ICD-10-CM | POA: Insufficient documentation

## 2016-11-27 DIAGNOSIS — Z9851 Tubal ligation status: Secondary | ICD-10-CM | POA: Insufficient documentation

## 2016-11-27 DIAGNOSIS — Z7982 Long term (current) use of aspirin: Secondary | ICD-10-CM | POA: Diagnosis not present

## 2016-11-27 DIAGNOSIS — Z880 Allergy status to penicillin: Secondary | ICD-10-CM | POA: Insufficient documentation

## 2016-11-27 DIAGNOSIS — I471 Supraventricular tachycardia: Secondary | ICD-10-CM | POA: Insufficient documentation

## 2016-11-27 DIAGNOSIS — D3A8 Other benign neuroendocrine tumors: Secondary | ICD-10-CM | POA: Insufficient documentation

## 2016-11-27 DIAGNOSIS — Z882 Allergy status to sulfonamides status: Secondary | ICD-10-CM | POA: Insufficient documentation

## 2016-11-27 DIAGNOSIS — C7B8 Other secondary neuroendocrine tumors: Secondary | ICD-10-CM | POA: Diagnosis not present

## 2016-11-27 HISTORY — PX: PORTA CATH INSERTION: CATH118285

## 2016-11-27 SURGERY — PORTA CATH INSERTION
Anesthesia: Moderate Sedation

## 2016-11-27 MED ORDER — METHYLPREDNISOLONE SODIUM SUCC 125 MG IJ SOLR
125.0000 mg | INTRAMUSCULAR | Status: DC | PRN
  Administered 2016-11-27: 125 mg via INTRAVENOUS

## 2016-11-27 MED ORDER — LIDOCAINE-EPINEPHRINE (PF) 2 %-1:200000 IJ SOLN
INTRAMUSCULAR | Status: AC
  Filled 2016-11-27: qty 20

## 2016-11-27 MED ORDER — PROMETHAZINE HCL 25 MG/ML IJ SOLN
INTRAMUSCULAR | Status: AC
  Filled 2016-11-27: qty 1

## 2016-11-27 MED ORDER — MIDAZOLAM HCL 5 MG/5ML IJ SOLN
INTRAMUSCULAR | Status: AC
  Filled 2016-11-27: qty 5

## 2016-11-27 MED ORDER — LIDOCAINE-PRILOCAINE 2.5-2.5 % EX CREA: TOPICAL_CREAM | CUTANEOUS | 3 refills | Status: AC

## 2016-11-27 MED ORDER — METHYLPREDNISOLONE SODIUM SUCC 125 MG IJ SOLR
INTRAMUSCULAR | Status: AC
  Administered 2016-11-27: 125 mg via INTRAVENOUS
  Filled 2016-11-27: qty 2

## 2016-11-27 MED ORDER — FAMOTIDINE 20 MG PO TABS
ORAL_TABLET | ORAL | Status: AC
  Filled 2016-11-27: qty 1

## 2016-11-27 MED ORDER — MIDAZOLAM HCL 2 MG/2ML IJ SOLN
INTRAMUSCULAR | Status: DC | PRN
  Administered 2016-11-27: 0.5 mg via INTRAVENOUS
  Administered 2016-11-27: 2 mg via INTRAVENOUS

## 2016-11-27 MED ORDER — FAMOTIDINE 20 MG PO TABS
40.0000 mg | ORAL_TABLET | ORAL | Status: DC | PRN
  Administered 2016-11-27: 40 mg via ORAL

## 2016-11-27 MED ORDER — ONDANSETRON HCL 4 MG/2ML IJ SOLN: 4.0000 mg | Freq: Four times a day (QID) | INTRAMUSCULAR | Status: DC | PRN

## 2016-11-27 MED ORDER — FENTANYL CITRATE (PF) 100 MCG/2ML IJ SOLN
INTRAMUSCULAR | Status: DC | PRN
  Administered 2016-11-27: 50 ug via INTRAVENOUS
  Administered 2016-11-27: 25 ug via INTRAVENOUS

## 2016-11-27 MED ORDER — CLINDAMYCIN PHOSPHATE 300 MG/50ML IV SOLN
INTRAVENOUS | Status: AC
  Administered 2016-11-27: 300 mg via INTRAVENOUS
  Filled 2016-11-27: qty 50

## 2016-11-27 MED ORDER — PROCHLORPERAZINE MALEATE 10 MG PO TABS: 10.0000 mg | ORAL_TABLET | Freq: Four times a day (QID) | ORAL | 2 refills | Status: DC | PRN

## 2016-11-27 MED ORDER — SODIUM CHLORIDE 0.9 % IV SOLN
INTRAVENOUS | Status: AC | PRN
  Administered 2016-11-27: 250 mL via INTRAVENOUS

## 2016-11-27 MED ORDER — HYDROMORPHONE HCL 1 MG/ML IJ SOLN: 1.0000 mg | Freq: Once | INTRAMUSCULAR | Status: DC | PRN

## 2016-11-27 MED ORDER — ONDANSETRON HCL 8 MG PO TABS: 8.0000 mg | ORAL_TABLET | Freq: Two times a day (BID) | ORAL | 2 refills | Status: DC | PRN

## 2016-11-27 MED ORDER — FAMOTIDINE 20 MG PO TABS
ORAL_TABLET | ORAL | Status: AC
  Administered 2016-11-27: 40 mg via ORAL
  Filled 2016-11-27: qty 1

## 2016-11-27 MED ORDER — HEPARIN (PORCINE) IN NACL 2-0.9 UNIT/ML-% IJ SOLN
INTRAMUSCULAR | Status: AC
  Filled 2016-11-27: qty 500

## 2016-11-27 MED ORDER — FENTANYL CITRATE (PF) 100 MCG/2ML IJ SOLN
INTRAMUSCULAR | Status: AC
  Filled 2016-11-27: qty 2

## 2016-11-27 MED ORDER — SODIUM CHLORIDE 0.9 % IV SOLN
INTRAVENOUS | Status: DC
  Administered 2016-11-27: 11:00:00 via INTRAVENOUS

## 2016-11-27 MED ORDER — PROMETHAZINE HCL 25 MG/ML IJ SOLN
12.5000 mg | Freq: Once | INTRAMUSCULAR | Status: AC
  Administered 2016-11-27: 12.5 mg via INTRAVENOUS

## 2016-11-27 MED ORDER — GENTAMICIN SULFATE 40 MG/ML IJ SOLN
Freq: Once | INTRAMUSCULAR | Status: DC
  Filled 2016-11-27: qty 2

## 2016-11-27 SURGICAL SUPPLY — 8 items
DRAPE INCISE IOBAN 66X45 STRL (DRAPES) ×3 IMPLANT
KIT PORT POWER 8FR ISP CVUE (Catheter) ×3 IMPLANT
NEEDLE ENTRY 21GA 7CM ECHOTIP (NEEDLE) ×3 IMPLANT
PACK ANGIOGRAPHY (CUSTOM PROCEDURE TRAY) ×3 IMPLANT
SET INTRO CAPELLA COAXIAL (SET/KITS/TRAYS/PACK) ×3 IMPLANT
SUT MNCRL AB 4-0 PS2 18 (SUTURE) ×3 IMPLANT
SUT PROLENE 0 CT 1 30 (SUTURE) ×3 IMPLANT
SUTURE VIC 3-0 (SUTURE) ×3 IMPLANT

## 2016-11-27 NOTE — Progress Notes (Signed)
  Oncology Nurse Navigator Documentation  Navigator Location: [facility] (11/27/16 1500)   )                      Patient Visit Type: RadOnc (11/27/16 1500) Treatment Phase: CT SIM (11/27/16 1500) Barriers/Navigation Needs: No barriers at this time;No Questions;No Needs (11/27/16 1500)   Interventions: None required (11/27/16 1500)         accompanied patient to CT Sim. Escorted pt and family to next appt for port placement. No questions or needs at this time.              Time Spent with Patient: 60 (11/27/16 1500)

## 2016-11-27 NOTE — Op Note (Signed)
OPERATIVE NOTE   PROCEDURE: 1. Placement of a right IJ Infuse-a-Port  PRE-OPERATIVE DIAGNOSIS: Neuroendocrine carcinoma  POST-OPERATIVE DIAGNOSIS: Same   SURGEON: Katha Cabal M.D.  ANESTHESIA: Conscious sedation was administered under my direct supervision by the interventional radiology RN. IV Versed plus fentanyl were utilized. Continuous ECG, pulse oximetry and blood pressure was monitored throughout the entire procedure. Conscious sedation was for a total of 32 minutes.  ESTIMATED BLOOD LOSS: Minimal   FINDING(S): 1.  Patent vein  SPECIMEN(S): None  INDICATIONS:   Latasha [name] is a 61 y.o. female who presents with stage IV neuroendocrine carcinoma. She will require chemotherapy and therefore requires appropriate intravenous access. Infuse-a-Port has been recommended. Risks and benefits were reviewed with the patient patient has agreed to proceed with placement of an Infuse-a-Port.  DESCRIPTION: After obtaining full informed written consent, the patient was brought back to the special procedure suite and placed in the supine position. The patient's right neck and chest wall are prepped and draped in sterile fashion. Appropriate timeout was called.  Ultrasound is placed in a sterile sleeve, ultrasound is utilized to avoid vascular injury as well as secondary to lack of appropriate landmarks. The right IJ vein is identified. It is echolucent and homogeneous as well as easily compressible indicating patency. An image is recorded for the permanent record.  Access to the vein with a micropuncture needle is done under direct ultrasound visualization.  1% lidocaine is infiltrated into the soft tissue at the base of the neck as well as on the chest wall.  Under direct ultrasound visualization a micro-needle is inserted into the vein followed by the micro-wire. Micro-sheath was then advanced and a J wire is inserted without difficulty under fluoroscopic guidance. A small  counterincision was created at the wire insertion site. A transverse incision is created 2 fingerbreadths below the scapula and a pocket is fashioned using both blunt and sharp dissection. The pocket is tested for appropriate size with the hub of the Infuse-a-Port. The tunneling device is then used to pull the intravascular portion of the catheter from the pocket to the neck counterincision.  Dilator and peel-away sheath were then inserted over the wire and the wire is removed. Catheter is then advanced into the venous system without difficulty. Peel-away sheath was then removed.  Catheter is then positioned under fluoroscopic guidance at the atrial caval junction. It is then transected connected to the hub and the hope is slipped into the subcutaneous pocket on the chest wall. The hub was then accessed percutaneously and aspirates easily and flushes well and is flushed with 30 cc of heparinized saline. The pocket incision is then closed in layers using interrupted 3-0 Vicryl for the subcutaneous tissues and 4-0 Monocryl subcuticular for skin closure. Dermabond is applied. The neck counterincision was closed with 4-0 Monocryl subcuticular and Dermabond as well.  The patient tolerated the procedure well and there were no immediate complications.  COMPLICATIONS: None  CONDITION: Unchanged  Katha Cabal M.D. [facility] Office: [phone]   11/27/2016, 2:25 PM

## 2016-11-27 NOTE — H&P (Signed)
[facility] History & Physical Update  The patient was interviewed and re-examined.  The patient's previous History and Physical has been reviewed and is unchanged.  There is no change in the plan of care. We plan to proceed with the scheduled procedure.  Hortencia Pilar, MD  11/27/2016, 1:46 PM

## 2016-11-27 NOTE — Patient Instructions (Signed)
Cisplatin injection What is this medicine? CISPLATIN (SIS pla tin) is a chemotherapy drug. It targets fast dividing cells, like cancer cells, and causes these cells to die. This medicine is used to treat many types of cancer like bladder, ovarian, and testicular cancers. This medicine may be used for other purposes; ask your health care provider or pharmacist if you have questions. COMMON BRAND NAME(S): Platinol, Platinol -AQ What should I tell my health care provider before I take this medicine? They need to know if you have any of these conditions: -blood disorders -hearing problems -kidney disease -recent or ongoing radiation therapy -an unusual or allergic reaction to cisplatin, carboplatin, other chemotherapy, other medicines, foods, dyes, or preservatives -pregnant or trying to get pregnant -breast-feeding How should I use this medicine? This drug is given as an infusion into a vein. It is administered in a hospital or clinic by a specially trained health care professional. Talk to your pediatrician regarding the use of this medicine in children. Special care may be needed. Overdosage: If you think you have taken too much of this medicine contact a poison control center or emergency room at once. NOTE: This medicine is only for you. Do not share this medicine with others. What if I miss a dose? It is important not to miss a dose. Call your doctor or health care professional if you are unable to keep an appointment. What may interact with this medicine? -dofetilide -foscarnet -medicines for seizures -medicines to increase blood counts like filgrastim, pegfilgrastim, sargramostim -probenecid -pyridoxine used with altretamine -rituximab -some antibiotics like amikacin, gentamicin, neomycin, polymyxin B, streptomycin, tobramycin -sulfinpyrazone -vaccines -zalcitabine Talk to your doctor or health care professional before taking any of these  medicines: -acetaminophen -aspirin -ibuprofen -ketoprofen -naproxen This list may not describe all possible interactions. Give your health care provider a list of all the medicines, herbs, non-prescription drugs, or dietary supplements you use. Also tell them if you smoke, drink alcohol, or use illegal drugs. Some items may interact with your medicine. What should I watch for while using this medicine? Your condition will be monitored carefully while you are receiving this medicine. You will need important blood work done while you are taking this medicine. This drug may make you feel generally unwell. This is not uncommon, as chemotherapy can affect healthy cells as well as cancer cells. Report any side effects. Continue your course of treatment even though you feel ill unless your doctor tells you to stop. In some cases, you may be given additional medicines to help with side effects. Follow all directions for their use. Call your doctor or health care professional for advice if you get a fever, chills or sore throat, or other symptoms of a cold or flu. Do not treat yourself. This drug decreases your body's ability to fight infections. Try to avoid being around people who are sick. This medicine may increase your risk to bruise or bleed. Call your doctor or health care professional if you notice any unusual bleeding. Be careful brushing and flossing your teeth or using a toothpick because you may get an infection or bleed more easily. If you have any dental work done, tell your dentist you are receiving this medicine. Avoid taking products that contain aspirin, acetaminophen, ibuprofen, naproxen, or ketoprofen unless instructed by your doctor. These medicines may hide a fever. Do not become pregnant while taking this medicine. Women should inform their doctor if they wish to become pregnant or think they might be pregnant. There is a   potential for serious side effects to an unborn child. Talk to  your health care professional or pharmacist for more information. Do not breast-feed an infant while taking this medicine. Drink fluids as directed while you are taking this medicine. This will help protect your kidneys. Call your doctor or health care professional if you get diarrhea. Do not treat yourself. What side effects may I notice from receiving this medicine? Side effects that you should report to your doctor or health care professional as soon as possible: -allergic reactions like skin rash, itching or hives, swelling of the face, lips, or tongue -signs of infection - fever or chills, cough, sore throat, pain or difficulty passing urine -signs of decreased platelets or bleeding - bruising, pinpoint red spots on the skin, black, tarry stools, nosebleeds -signs of decreased red blood cells - unusually weak or tired, fainting spells, lightheadedness -breathing problems -changes in hearing -gout pain -low blood counts - This drug may decrease the number of white blood cells, red blood cells and platelets. You may be at increased risk for infections and bleeding. -nausea and vomiting -pain, swelling, redness or irritation at the injection site -pain, tingling, numbness in the hands or feet -problems with balance, movement -trouble passing urine or change in the amount of urine Side effects that usually do not require medical attention (report to your doctor or health care professional if they continue or are bothersome): -changes in vision -loss of appetite -metallic taste in the mouth or changes in taste This list may not describe all possible side effects. Call your doctor for medical advice about side effects. You may report side effects to FDA at 1-800-FDA-1088. Where should I keep my medicine? This drug is given in a hospital or clinic and will not be stored at home. NOTE: This sheet is a summary. It may not cover all possible information. If you have questions about this medicine,  talk to your doctor, pharmacist, or health care provider.  2018 Elsevier/Gold Standard (2007-09-08 14:40:54) Etoposide, VP-16 injection What is this medicine? ETOPOSIDE, VP-16 (e toe POE side) is a chemotherapy drug. It is used to treat testicular cancer, lung cancer, and other cancers. This medicine may be used for other purposes; ask your health care provider or pharmacist if you have questions. COMMON BRAND NAME(S): Etopophos, Toposar, VePesid What should I tell my health care provider before I take this medicine? They need to know if you have any of these conditions: -infection -kidney disease -liver disease -low blood counts, like low white cell, platelet, or red cell counts -an unusual or allergic reaction to etoposide, other medicines, foods, dyes, or preservatives -pregnant or trying to get pregnant -breast-feeding How should I use this medicine? This medicine is for infusion into a vein. It is administered in a hospital or clinic by a specially trained health care professional. Talk to your pediatrician regarding the use of this medicine in children. Special care may be needed. Overdosage: If you think you have taken too much of this medicine contact a poison control center or emergency room at once. NOTE: This medicine is only for you. Do not share this medicine with others. What if I miss a dose? It is important not to miss your dose. Call your doctor or health care professional if you are unable to keep an appointment. What may interact with this medicine? -aspirin -certain medications for seizures like carbamazepine, phenobarbital, phenytoin, valproic acid -cyclosporine -levamisole -warfarin This list may not describe all possible interactions. Give your health  care provider a list of all the medicines, herbs, non-prescription drugs, or dietary supplements you use. Also tell them if you smoke, drink alcohol, or use illegal drugs. Some items may interact with your  medicine. What should I watch for while using this medicine? Visit your doctor for checks on your progress. This drug may make you feel generally unwell. This is not uncommon, as chemotherapy can affect healthy cells as well as cancer cells. Report any side effects. Continue your course of treatment even though you feel ill unless your doctor tells you to stop. In some cases, you may be given additional medicines to help with side effects. Follow all directions for their use. Call your doctor or health care professional for advice if you get a fever, chills or sore throat, or other symptoms of a cold or flu. Do not treat yourself. This drug decreases your body's ability to fight infections. Try to avoid being around people who are sick. This medicine may increase your risk to bruise or bleed. Call your doctor or health care professional if you notice any unusual bleeding. Talk to your doctor about your risk of cancer. You may be more at risk for certain types of cancers if you take this medicine. Do not become pregnant while taking this medicine or for at least 6 months after stopping it. Women should inform their doctor if they wish to become pregnant or think they might be pregnant. Women of child-bearing potential will need to have a negative pregnancy test before starting this medicine. There is a potential for serious side effects to an unborn child. Talk to your health care professional or pharmacist for more information. Do not breast-feed an infant while taking this medicine. Men must use a latex condom during sexual contact with a woman while taking this medicine and for at least 4 months after stopping it. A latex condom is needed even if you have had a vasectomy. Contact your doctor right away if your partner becomes pregnant. Do not donate sperm while taking this medicine and for at least 4 months after you stop taking this medicine. Men should inform their doctors if they wish to father a child.  This medicine may lower sperm counts. What side effects may I notice from receiving this medicine? Side effects that you should report to your doctor or health care professional as soon as possible: -allergic reactions like skin rash, itching or hives, swelling of the face, lips, or tongue -low blood counts - this medicine may decrease the number of white blood cells, red blood cells and platelets. You may be at increased risk for infections and bleeding. -signs of infection - fever or chills, cough, sore throat, pain or difficulty passing urine -signs of decreased platelets or bleeding - bruising, pinpoint red spots on the skin, black, tarry stools, blood in the urine -signs of decreased red blood cells - unusually weak or tired, fainting spells, lightheadedness -breathing problems -changes in vision -mouth or throat sores or ulcers -pain, redness, swelling or irritation at the injection site -pain, tingling, numbness in the hands or feet -redness, blistering, peeling or loosening of the skin, including inside the mouth -seizures -vomiting Side effects that usually do not require medical attention (report to your doctor or health care professional if they continue or are bothersome): -diarrhea -hair loss -loss of appetite -nausea -stomach pain This list may not describe all possible side effects. Call your doctor for medical advice about side effects. You may report side effects to  FDA at 1-800-FDA-1088. Where should I keep my medicine? This drug is given in a hospital or clinic and will not be stored at home. NOTE: This sheet is a summary. It may not cover all possible information. If you have questions about this medicine, talk to your doctor, pharmacist, or health care provider.  2018 Elsevier/Gold Standard (2015-05-26 11:53:23) Pegfilgrastim injection What is this medicine? PEGFILGRASTIM (PEG fil gra stim) is a long-acting granulocyte colony-stimulating factor that stimulates the  growth of neutrophils, a type of white blood cell important in the body's fight against infection. It is used to reduce the incidence of fever and infection in patients with certain types of cancer who are receiving chemotherapy that affects the bone marrow, and to increase survival after being exposed to high doses of radiation. This medicine may be used for other purposes; ask your health care provider or pharmacist if you have questions. COMMON BRAND NAME(S): Neulasta What should I tell my health care provider before I take this medicine? They need to know if you have any of these conditions: -kidney disease -latex allergy -ongoing radiation therapy -sickle cell disease -skin reactions to acrylic adhesives (On-Body Injector only) -an unusual or allergic reaction to pegfilgrastim, filgrastim, other medicines, foods, dyes, or preservatives -pregnant or trying to get pregnant -breast-feeding How should I use this medicine? This medicine is for injection under the skin. If you get this medicine at home, you will be taught how to prepare and give the pre-filled syringe or how to use the On-body Injector. Refer to the patient Instructions for Use for detailed instructions. Use exactly as directed. Tell your healthcare provider immediately if you suspect that the On-body Injector may not have performed as intended or if you suspect the use of the On-body Injector resulted in a missed or partial dose. It is important that you put your used needles and syringes in a special sharps container. Do not put them in a trash can. If you do not have a sharps container, call your pharmacist or healthcare provider to get one. Talk to your pediatrician regarding the use of this medicine in children. While this drug may be prescribed for selected conditions, precautions do apply. Overdosage: If you think you have taken too much of this medicine contact a poison control center or emergency room at once. NOTE: This  medicine is only for you. Do not share this medicine with others. What if I miss a dose? It is important not to miss your dose. Call your doctor or health care professional if you miss your dose. If you miss a dose due to an On-body Injector failure or leakage, a new dose should be administered as soon as possible using a single prefilled syringe for manual use. What may interact with this medicine? Interactions have not been studied. Give your health care provider a list of all the medicines, herbs, non-prescription drugs, or dietary supplements you use. Also tell them if you smoke, drink alcohol, or use illegal drugs. Some items may interact with your medicine. This list may not describe all possible interactions. Give your health care provider a list of all the medicines, herbs, non-prescription drugs, or dietary supplements you use. Also tell them if you smoke, drink alcohol, or use illegal drugs. Some items may interact with your medicine. What should I watch for while using this medicine? You may need blood work done while you are taking this medicine. If you are going to need a MRI, CT scan, or other procedure, tell  your doctor that you are using this medicine (On-Body Injector only). What side effects may I notice from receiving this medicine? Side effects that you should report to your doctor or health care professional as soon as possible: -allergic reactions like skin rash, itching or hives, swelling of the face, lips, or tongue -dizziness -fever -pain, redness, or irritation at site where injected -pinpoint red spots on the skin -red or dark-brown urine -shortness of breath or breathing problems -stomach or side pain, or pain at the shoulder -swelling -tiredness -trouble passing urine or change in the amount of urine Side effects that usually do not require medical attention (report to your doctor or health care professional if they continue or are bothersome): -bone pain -muscle  pain This list may not describe all possible side effects. Call your doctor for medical advice about side effects. You may report side effects to FDA at 1-800-FDA-1088. Where should I keep my medicine? Keep out of the reach of children. Store pre-filled syringes in a refrigerator between 2 and 8 degrees C (36 and 46 degrees F). Do not freeze. Keep in carton to protect from light. Throw away this medicine if it is left out of the refrigerator for more than 48 hours. Throw away any unused medicine after the expiration date. NOTE: This sheet is a summary. It may not cover all possible information. If you have questions about this medicine, talk to your doctor, pharmacist, or health care provider.  2018 Elsevier/Gold Standard (2016-05-30 12:58:03)

## 2016-11-28 ENCOUNTER — Encounter: Payer: Self-pay | Admitting: Vascular Surgery

## 2016-11-28 ENCOUNTER — Inpatient Hospital Stay: Payer: 59

## 2016-11-30 NOTE — Progress Notes (Signed)
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  INTERVAL HISTORY: Patient returns to clinic today for further evaluation and initiation of cycle 1, day 1 of cisplatin and etoposide. Her performance status has declined, she has a poor appetite and her family reports worsening confusion. She does not complain of pain today. She has no focal neurologic complaints. She denies any recent fevers or illnesses. She denies any chest pain, shortness of breath, cough, or hemoptysis. She has no nausea, vomiting, constipation, or diarrhea. She has no urinary complaints. Patient otherwise feels well and offers no further specific complaints.  REVIEW OF SYSTEMS:   Review of Systems  Constitutional: Positive for malaise/fatigue and weight loss. Negative for fever.  Respiratory: Negative.  Negative for cough, hemoptysis and shortness of breath.   Cardiovascular: Negative.  Negative for chest pain and leg swelling.  Gastrointestinal: Negative.  Negative for abdominal pain, blood in stool and melena.  Genitourinary: Positive for flank pain.  Skin: Negative.   Neurological: Positive for weakness. Negative for sensory change.  Psychiatric/Behavioral: Positive for memory loss. The patient is nervous/anxious.     As per HPI. Otherwise, a complete review of systems is negative.  PAST MEDICAL HISTORY: Past Medical History:  Diagnosis Date  . Coronary artery disease    Cardiac catheterization in January of 2015 showed mild nonobstructive three-vessel coronary artery disease with significant mid LAD spasm which improved with nitroglycerin. Metoprolol was switched to diltiazem  . Hyperlipidemia   . Hypertension   .  PSVT (paroxysmal supraventricular tachycardia) ([place])     PAST SURGICAL HISTORY: Past Surgical History:  Procedure Laterality Date  . CARDIAC CATHETERIZATION  06/2013   [facility]  . PORTA CATH INSERTION N/A 11/27/2016   Procedure: Glori Luis Cath Insertion;  Surgeon: Katha Cabal, MD;  Location: [facility] [facility];  Service: Cardiovascular;  Laterality: N/A;  . SUPRAVENTRICULAR TACHYCARDIA ABLATION N/A 04/22/2014   Procedure: SUPRAVENTRICULAR TACHYCARDIA ABLATION;  Surgeon: Evans Lance, MD;  Location: [facility] CATH [facility];  Service: Cardiovascular;  Laterality: N/A;  . TONSILLECTOMY    . TUBAL LIGATION      FAMILY HISTORY: Family History  Problem Relation Age of Onset  . Stroke Mother   . Hypertension Mother   . Heart attack Father   . Thyroid cancer Sister   . Stroke Sister     ADVANCED DIRECTIVES (Y/N):  N  HEALTH MAINTENANCE: Social History  Substance Use Topics  . Smoking status: Current Some Day Smoker    Years: 30.00    Types: Cigarettes  . Smokeless tobacco: Never Used  . Alcohol use No     Colonoscopy:  PAP:  Bone density:  Lipid panel:  Allergies  Allergen Reactions  . Bee Venom Swelling  . Codeine Nausea And Vomiting    Pruritic rash  . Sulfa Antibiotics Other (See Comments)    Other reaction(s): Pruritic rash  . Penicillin G Rash    Has patient had a PCN reaction causing immediate rash, facial/tongue/throat swelling, SOB or lightheadedness with hypotension: Yes Has patient had a PCN reaction causing severe rash involving mucus membranes or skin necrosis: No Has patient had a PCN reaction that required hospitalization No Has patient had a PCN reaction occurring within the last 10 years: No If all  of the above answers are "NO", then may proceed with Cephalosporin use.   . Shellfish Allergy Rash    Current Outpatient Prescriptions  Medication Sig Dispense Refill  . albuterol (PROVENTIL HFA;VENTOLIN HFA) 108 (90 BASE) MCG/ACT inhaler Inhale 2 puffs into the  lungs every 4 (four) hours as needed for wheezing or shortness of breath.    . fentaNYL (DURAGESIC - DOSED MCG/HR) 25 MCG/HR patch Place 1 patch (25 mcg total) onto the skin every 3 (three) days. 10 patch 0  . lidocaine (LIDODERM) 5 % Place 1 patch onto the skin daily. Remove & Discard patch within 12 hours or as directed by MD 30 patch 0  . lidocaine-prilocaine (EMLA) cream Apply to affected area once 30 g 3  . lisinopril (PRINIVIL,ZESTRIL) 10 MG tablet Take 10 mg by mouth daily.    Marland Kitchen omeprazole (PRILOSEC) 20 MG capsule Take 40 mg by mouth daily.    . ondansetron (ZOFRAN) 8 MG tablet Take 1 tablet (8 mg total) by mouth 2 (two) times daily as needed. Start on the third day after cisplatin chemotherapy. 60 tablet 2  . prochlorperazine (COMPAZINE) 10 MG tablet Take 1 tablet (10 mg total) by mouth every 6 (six) hours as needed (Nausea or vomiting). 60 tablet 2  . sucralfate (CARAFATE) 1 g tablet Take 1 tablet (1 g total) by mouth 2 (two) times daily. Dissolve tablet in water then drink. 60 tablet 2  . traMADol (ULTRAM) 50 MG tablet Take 1 tablet (50 mg total) by mouth 3 (three) times daily as needed. (Patient not taking: Reported on 12/02/2016) 60 tablet 0   No current facility-administered medications for this visit.    Facility-Administered Medications Ordered in Other Visits  Medication Dose Route Frequency Provider Last Rate Last Dose  . heparin lock flush 100 unit/mL  500 Units Intracatheter Once PRN Lloyd Huger, MD      . sodium chloride flush (NS) 0.9 % injection 10 mL  10 mL Intracatheter PRN Lloyd Huger, MD   10 mL at 12/02/16 1036    OBJECTIVE: Vitals:   12/02/16 0904  BP: 91/63  Pulse: (!) 125  Resp: 18  Temp: 98.8 F (37.1 C)     Body mass index is 23.56 kg/m.    ECOG FS:1 - Symptomatic but completely ambulatory  General: Well-developed, well-nourished, no acute distress. Eyes: Pink conjunctiva, anicteric sclera. Lungs: Clear to auscultation  bilaterally. Heart: Regular rate and rhythm. No rubs, murmurs, or gallops. Abdomen: Soft, nontender, nondistended. No organomegaly noted, normoactive bowel sounds. Musculoskeletal: No edema, cyanosis, or clubbing. Neuro: Alert, answering all questions appropriately. Cranial nerves grossly intact. Skin: No rashes or petechiae noted. Psych: Normal affect.   LAB RESULTS:  Lab Results  Component Value Date   NA 124 (L) 12/02/2016   K 5.2 (H) 12/02/2016   CL 91 (L) 12/02/2016   CO2 12 (L) 12/02/2016   GLUCOSE 71 12/02/2016   BUN 41 (H) 12/02/2016   CREATININE 1.55 (H) 12/02/2016   CALCIUM 9.3 12/02/2016   PROT 6.1 (L) 12/02/2016   ALBUMIN 2.7 (L) 12/02/2016   AST 196 (H) 12/02/2016   ALT 77 (H) 12/02/2016   ALKPHOS 672 (H) 12/02/2016   BILITOT 6.0 (H) 12/02/2016   GFRNONAA 35 (L) 12/02/2016   GFRAA 41 (L) 12/02/2016    Lab Results  Component Value Date   WBC 17.8 (H) 12/02/2016   NEUTROABS 13.6 (H) 12/02/2016   HGB 14.2 12/02/2016   HCT 43.3 12/02/2016   MCV 97.0 12/02/2016  PLT 250 12/02/2016     STUDIES: Ct Chest W Contrast  Result Date: 11/07/2016 CLINICAL DATA:  Pt states she's had RUQ/right side pain up under ribs x2wks, pain is sharp, feels like a baseball under ribs, nausea/vomiting/severe heartburn. Weight loss of 10 pounds in 1 week. Back pain for 2 days. History of smoking, cardiac ablation, tubal ligation, C-section. EXAM: CT CHEST, ABDOMEN, AND PELVIS WITH CONTRAST TECHNIQUE: Multidetector CT imaging of the chest, abdomen and pelvis was performed following the standard protocol during bolus administration of intravenous contrast. CONTRAST:  16mL ISOVUE-300 IOPAMIDOL (ISOVUE-300) INJECTION 61% COMPARISON:  Ultrasound of the abdomen on 11/04/2016 FINDINGS: CT CHEST FINDINGS Cardiovascular: Moderate coronary artery calcifications. The heart size is normal. There is thoracic aortic atherosclerosis. The aorta is not aneurysmal. Main pulmonary artery is upper limits  normal in size. There is attenuation of the branches of the right pulmonary artery secondary to right hilar and mediastinal mass. See below. Lung interface of the mass with the right main pulmonary artery. Mediastinum/Nodes: Right hilar mass is all contiguous with spiculated mass in the right upper lobe. Right hilar and mediastinal adenopathy is 5.0 x 3.9 cm, measured on coronal image number 61 of series 6. Enlarged nodes are identified in the pretracheal region (1.8 cm), subcarinal region (2.1 cm), and superior mediastinum (0.9 cm). Axillary regions are unremarkable. Esophagus is normal in appearance. Lungs/Pleura: Marked emphysematous changes are identified throughout the lungs, primarily involving the apices. Spiculated mass is identified within the right upper lobe, contiguous with adenopathy in the right hilar region. The spiculated mass is estimated to be 3.0 x 2.7 cm. Within the left upper lobe, there is a solid and spiculated mass which measures 1.3 x 1.7 cm. Lesion is suspicious for synchronous primary. A 5 mm nodule is identified in the left lower lobe, best seen on image 78 of series 5. There is marked narrowing and near complete occlusion of the right upper lobe bronchus by right hilar /right upper lobe mass. Long interface of the hilar mass with the trachea, right mainstem bronchus, bronchus intermedius, and lower lobe bronchus. Musculoskeletal: Numerous lytic lesions in soft tissue mass are identified within the right seventh rib. There are small lytic foci within the vertebral bodies T9, T10. Additional: Somewhat nodular appearance to the breast tissue in the retroareolar regions bilaterally, right greater than left. Further evaluation is indicated. CT ABDOMEN PELVIS FINDINGS Hepatobiliary: The liver is enlarged by a numerous low-attenuation lesions. Lesions range in size from 5 mm to 3 cm. Portal vein is patent. Gallbladder is present and displaced into the pelvis by the enlarged liver. Pancreas:  Unremarkable. No pancreatic ductal dilatation or surrounding inflammatory changes. Spleen: Normal in size without focal abnormality. Adrenals/Urinary Tract: Adrenal glands are unremarkable. Kidneys are normal, without renal calculi, focal lesion, or hydronephrosis. Bladder is unremarkable. Stomach/Bowel: The stomach and small bowel loops are normal in appearance. Colon is normal in appearance. The appendix is well seen and has a normal appearance. Vascular/Lymphatic: There is significant atherosclerotic calcification of the abdominal aorta. Reproductive: The uterus is present.  No adnexal mass. Other: No free pelvic fluid. Musculoskeletal: Mild degenerative changes are seen in the lumbar spine. IMPRESSION: 1. 3.0 cm spiculated right upper lobe mass and significant right hilar and mediastinal adenopathy. Findings are compatible with malignancy. There is associated near occlusion of the right upper lobe bronchus. 2. Left upper lobe mass 1.7 cm suspicious for synchronous primary. 3. 5 mm nodule in the left lower lobe. 4. Marked hepatomegaly secondary to extensive  metastatic disease. 5. Bilateral breast nodules, right greater than left. Bilateral diagnostic mammogram and bilateral breast ultrasound are indicated for further evaluation. 6. Normal adrenal glands. 7. Osseous metastatic disease including expansion and lytic lesion of the right seventh rib as well as vertebral bodies T9 and T10. 8. Coronary artery disease. 9. Aortic atherosclerosis. The salient findings were discussed with Barbaraann Boys on 11/07/2016 at 12:34 pm. Electronically Signed   By: Nolon Nations M.D.   On: 11/07/2016 12:35   Mr Jeri Cos GG Contrast  Result Date: 11/20/2016 CLINICAL DATA:  Headache. Dizziness. Balance disturbance. Weakness. Metastatic lung cancer. EXAM: MRI HEAD WITHOUT AND WITH CONTRAST TECHNIQUE: Multiplanar, multiecho pulse sequences of the brain and surrounding structures were obtained without and with intravenous contrast.  CONTRAST:  32mL MULTIHANCE GADOBENATE DIMEGLUMINE 529 MG/ML IV SOLN COMPARISON:  None. FINDINGS: Brain: Diffusion imaging does not show any acute or subacute infarction. There are chronic small-vessel ischemic changes affecting the pons and cerebral hemispheric white matter, moderate in degree and premature for age. No sign of primary or metastatic intracranial mass lesion, hemorrhage, hydrocephalus or extra-axial collection. No abnormal intracranial enhancement. Vascular: Major vessels at the base of the brain show flow. Skull and upper cervical spine: Indeterminate 8 mm marrow focus in the right inferior posterior parietal region. This could possibly be a bone metastasis but is nonspecific. Sinuses/Orbits: Clear/normal Other: None IMPRESSION: No evidence of intracranial metastatic disease or acute intracranial finding. Moderate chronic small-vessel ischemic changes of the brain, premature for age. 8 mm nonspecific focus within the right posterior inferior parietal calvarium. This could possibly be an early bone metastasis. Electronically Signed   By: Nelson Chimes M.D.   On: 11/20/2016 10:25   Ct Abdomen Pelvis W Contrast  Result Date: 11/07/2016 CLINICAL DATA:  Pt states she's had RUQ/right side pain up under ribs x2wks, pain is sharp, feels like a baseball under ribs, nausea/vomiting/severe heartburn. Weight loss of 10 pounds in 1 week. Back pain for 2 days. History of smoking, cardiac ablation, tubal ligation, C-section. EXAM: CT CHEST, ABDOMEN, AND PELVIS WITH CONTRAST TECHNIQUE: Multidetector CT imaging of the chest, abdomen and pelvis was performed following the standard protocol during bolus administration of intravenous contrast. CONTRAST:  90mL ISOVUE-300 IOPAMIDOL (ISOVUE-300) INJECTION 61% COMPARISON:  Ultrasound of the abdomen on 11/04/2016 FINDINGS: CT CHEST FINDINGS Cardiovascular: Moderate coronary artery calcifications. The heart size is normal. There is thoracic aortic atherosclerosis. The  aorta is not aneurysmal. Main pulmonary artery is upper limits normal in size. There is attenuation of the branches of the right pulmonary artery secondary to right hilar and mediastinal mass. See below. Lung interface of the mass with the right main pulmonary artery. Mediastinum/Nodes: Right hilar mass is all contiguous with spiculated mass in the right upper lobe. Right hilar and mediastinal adenopathy is 5.0 x 3.9 cm, measured on coronal image number 61 of series 6. Enlarged nodes are identified in the pretracheal region (1.8 cm), subcarinal region (2.1 cm), and superior mediastinum (0.9 cm). Axillary regions are unremarkable. Esophagus is normal in appearance. Lungs/Pleura: Marked emphysematous changes are identified throughout the lungs, primarily involving the apices. Spiculated mass is identified within the right upper lobe, contiguous with adenopathy in the right hilar region. The spiculated mass is estimated to be 3.0 x 2.7 cm. Within the left upper lobe, there is a solid and spiculated mass which measures 1.3 x 1.7 cm. Lesion is suspicious for synchronous primary. A 5 mm nodule is identified in the left lower lobe, best seen on  image 78 of series 5. There is marked narrowing and near complete occlusion of the right upper lobe bronchus by right hilar /right upper lobe mass. Long interface of the hilar mass with the trachea, right mainstem bronchus, bronchus intermedius, and lower lobe bronchus. Musculoskeletal: Numerous lytic lesions in soft tissue mass are identified within the right seventh rib. There are small lytic foci within the vertebral bodies T9, T10. Additional: Somewhat nodular appearance to the breast tissue in the retroareolar regions bilaterally, right greater than left. Further evaluation is indicated. CT ABDOMEN PELVIS FINDINGS Hepatobiliary: The liver is enlarged by a numerous low-attenuation lesions. Lesions range in size from 5 mm to 3 cm. Portal vein is patent. Gallbladder is present and  displaced into the pelvis by the enlarged liver. Pancreas: Unremarkable. No pancreatic ductal dilatation or surrounding inflammatory changes. Spleen: Normal in size without focal abnormality. Adrenals/Urinary Tract: Adrenal glands are unremarkable. Kidneys are normal, without renal calculi, focal lesion, or hydronephrosis. Bladder is unremarkable. Stomach/Bowel: The stomach and small bowel loops are normal in appearance. Colon is normal in appearance. The appendix is well seen and has a normal appearance. Vascular/Lymphatic: There is significant atherosclerotic calcification of the abdominal aorta. Reproductive: The uterus is present.  No adnexal mass. Other: No free pelvic fluid. Musculoskeletal: Mild degenerative changes are seen in the lumbar spine. IMPRESSION: 1. 3.0 cm spiculated right upper lobe mass and significant right hilar and mediastinal adenopathy. Findings are compatible with malignancy. There is associated near occlusion of the right upper lobe bronchus. 2. Left upper lobe mass 1.7 cm suspicious for synchronous primary. 3. 5 mm nodule in the left lower lobe. 4. Marked hepatomegaly secondary to extensive metastatic disease. 5. Bilateral breast nodules, right greater than left. Bilateral diagnostic mammogram and bilateral breast ultrasound are indicated for further evaluation. 6. Normal adrenal glands. 7. Osseous metastatic disease including expansion and lytic lesion of the right seventh rib as well as vertebral bodies T9 and T10. 8. Coronary artery disease. 9. Aortic atherosclerosis. The salient findings were discussed with Barbaraann Boys on 11/07/2016 at 12:34 pm. Electronically Signed   By: Nolon Nations M.D.   On: 11/07/2016 12:35   Nm Pet Image Initial (pi) Skull Base To Thigh  Result Date: 11/25/2016 CLINICAL DATA:  Initial treatment strategy for large cell neuroendocrine carcinoma. EXAM: NUCLEAR MEDICINE PET SKULL BASE TO THIGH TECHNIQUE: 12.6 mCi F-18 FDG was injected intravenously.  Full-ring PET imaging was performed from the skull base to thigh after the radiotracer. CT data was obtained and used for attenuation correction and anatomic localization. FASTING BLOOD GLUCOSE:  Value: 86 mg/dl COMPARISON:  CT chest abdomen pelvis 11/07/2016. FINDINGS: NECK No hypermetabolic lymph nodes in the neck. CT images show no acute findings. CHEST There are hypermetabolic low right internal jugular, bilateral mediastinal and right hilar lymph nodes. Index right paratracheal lymph node measures 1.5 cm with an SUV max of 11.9. Perihilar right middle lobe spiculated mass measures approximately 3.2 cm with an SUV max of 3.8. 1.5 cm lingular nodule is hypermetabolic as well. Atherosclerotic calcification of the arterial vasculature, including coronary arteries. No pericardial or pleural effusion. ABDOMEN/PELVIS Liver is completely replaced with ill-defined low-attenuation lesions and is diffusely hypermetabolic with an index SUV max of 17.7. There are hypermetabolic periportal lymph nodes as well. No abnormal hypermetabolism in the adrenal glands, spleen or pancreas. No additional hypermetabolic lymph nodes. Liver is markedly enlarged. Gallbladder, adrenal glands, kidneys and spleen are grossly unremarkable. Calcifications are seen in the pancreas. Stomach and small bowel are  grossly unremarkable. Small pelvic free fluid. SKELETON Multiple hypermetabolic lesions are seen throughout the visualized osseous structures. Index hypermetabolic lesion in the central sacrum has an SUV max of 6.6. IMPRESSION: 1. Hypermetabolic right middle lobe mass and lingular nodule with hypermetabolic low internal jugular/mediastinal/right hilar adenopathy, diffuse hypermetabolic involvement of the liver, periportal adenopathy and multiple hypermetabolic osseous lesions, most consistent with stage IV primary bronchogenic carcinoma (large-cell neuroendocrine carcinoma by biopsy). 2. Small pelvic free fluid. 3. Aortic atherosclerosis  (ICD10-170.0). Coronary artery calcification. 4. Chronic calcific pancreatitis. Electronically Signed   By: Lorin Picket M.D.   On: 11/25/2016 10:37   US Biopsy  Result Date: 11/18/2016 CLINICAL DATA:  Bilateral pulmonary masses, skeletal metastatic disease and widespread metastatic disease throughout the liver. Referral for liver biopsy to establish tissue diagnosis. EXAM: ULTRASOUND GUIDED CORE BIOPSY OF LIVER MEDICATIONS: 2.0 mg IV Versed; 100 mcg IV Fentanyl Total Moderate Sedation Time: 18 minutes. The patient's level of consciousness and physiologic status were continuously monitored during the procedure by Radiology nursing. PROCEDURE: The procedure, risks, benefits, and alternatives were explained to the patient. Questions regarding the procedure were encouraged and answered. The patient understands and consents to the procedure. A time out was performed prior to initiating the procedure. Ultrasound was used to localize liver lesions. The abdominal wall was prepped with chlorhexidine in a sterile fashion, and a sterile drape was applied covering the operative field. A sterile gown and sterile gloves were used for the procedure. Local anesthesia was provided with 1% Lidocaine. Under direct ultrasound guidance, a 17 gauge needle was advanced into the left lobe of the liver. After confirming needle tip position, coaxial 18 gauge core biopsy samples were performed with 3 samples obtained and submitted in formalin. Additional ultrasound was performed after outer needle removal. COMPLICATIONS: None. FINDINGS: Multitude of masses are seen throughout the liver parenchyma which are relatively hyperechoic relative to adjacent normal parenchyma. The left lobe was chosen for biopsy and a specific lesion measuring approximately 2.9 cm in greatest diameter was targeted. Solid tissue was obtained. IMPRESSION: Ultrasound-guided core biopsy performed within the left lobe of the liver at the level of a lesion. The liver  is diffusely involved with a multitude of metastatic lesions. Electronically Signed   By: Aletta Edouard M.D.   On: 11/18/2016 12:50   US Abdomen Limited Ruq  Result Date: 11/04/2016 CLINICAL DATA:  Right upper quadrant pain EXAM: US ABDOMEN LIMITED - RIGHT UPPER QUADRANT COMPARISON:  07/31/2011 FINDINGS: Gallbladder: No gallstones or wall thickening visualized. No sonographic Murphy sign noted by sonographer. Common bile duct: Diameter: 4 mm Liver: The liver demonstrates multiple mass lesions within. The largest of these lines in the right lobe measuring 3.6 cm in greatest dimension. These are consistent with metastatic disease till proven otherwise. Continued workup is recommended. IMPRESSION: Changes consistent with hepatic metastatic disease. Further workup is recommended. Electronically Signed   By: Inez Catalina M.D.   On: 11/04/2016 18:20    ASSESSMENT: Stage IV large cell neuroendocrine carcinoma.  PLAN:    1. Stage IV large cell neuroendocrine carcinoma: PET scan results reviewed independently and reported as above with widespread lesions consistent with metastatic disease. Patient's AFP is normal, but the remainder of her tumor markers are significantly elevated. MRI of the brain is negative for metastatic disease. After lengthy discussion with the patient, she wishes to pursue aggressive treatment with chemotherapy using cisplatin and etoposide. Patient will also receive Neulasta support. Given patient's worsening liver function and elevated bilirubin, etoposide will be  dose reduce 50%. Will do 4 cycles every 3 weeks and then reimage. Proceed with cycle 1, day 1 of cisplatin and etoposide. Return to clinic in 1 and 2 days for etoposide only and then in 1 week for further evaluation. 2. Elevated liver enzymes/bilirubin: Likely secondary to progressive disease. Will treat with dose reduced etoposide as above.. 3. Hypercalcemia: Improved. Secondary to underlying malignancy. Patient received IV  Zometa last week. 4. Right flank pain: Likely secondary to malignancy. Patient will be receiving palliative XRT.  5. Disposition: Patient's poor prognosis as well as consideration of hospice and end-of-life care were discussed briefly today, but patient wishes to continue with aggressive treatment at this time.  Approximately 30 minutes was spent in discussion of which greater than 50% was consultation.  Patient expressed understanding and was in agreement with this plan. She also understands that She can call clinic at any time with any questions, concerns, or complaints.   Cancer Staging No matching staging information was found for the patient.  Lloyd Huger, MD   12/02/2016 6:35 PM

## 2016-12-02 ENCOUNTER — Inpatient Hospital Stay: Payer: 59

## 2016-12-02 ENCOUNTER — Ambulatory Visit: Payer: 59 | Admitting: Oncology

## 2016-12-02 ENCOUNTER — Encounter: Payer: Self-pay | Admitting: *Deleted

## 2016-12-02 ENCOUNTER — Inpatient Hospital Stay (HOSPITAL_BASED_OUTPATIENT_CLINIC_OR_DEPARTMENT_OTHER): Payer: 59 | Admitting: Oncology

## 2016-12-02 VITALS — BP 96/62 | HR 108 | Resp 18

## 2016-12-02 VITALS — BP 91/63 | HR 125 | Temp 98.8°F | Resp 18 | Wt 141.6 lb

## 2016-12-02 DIAGNOSIS — I10 Essential (primary) hypertension: Secondary | ICD-10-CM | POA: Diagnosis not present

## 2016-12-02 DIAGNOSIS — N6320 Unspecified lump in the left breast, unspecified quadrant: Secondary | ICD-10-CM

## 2016-12-02 DIAGNOSIS — R748 Abnormal levels of other serum enzymes: Secondary | ICD-10-CM

## 2016-12-02 DIAGNOSIS — N6310 Unspecified lump in the right breast, unspecified quadrant: Secondary | ICD-10-CM

## 2016-12-02 DIAGNOSIS — C7A8 Other malignant neuroendocrine tumors: Secondary | ICD-10-CM

## 2016-12-02 DIAGNOSIS — I2510 Atherosclerotic heart disease of native coronary artery without angina pectoris: Secondary | ICD-10-CM

## 2016-12-02 DIAGNOSIS — R531 Weakness: Secondary | ICD-10-CM

## 2016-12-02 DIAGNOSIS — E785 Hyperlipidemia, unspecified: Secondary | ICD-10-CM

## 2016-12-02 DIAGNOSIS — I700 Atherosclerosis of aorta: Secondary | ICD-10-CM

## 2016-12-02 DIAGNOSIS — I471 Supraventricular tachycardia: Secondary | ICD-10-CM

## 2016-12-02 DIAGNOSIS — F17210 Nicotine dependence, cigarettes, uncomplicated: Secondary | ICD-10-CM

## 2016-12-02 DIAGNOSIS — R109 Unspecified abdominal pain: Secondary | ICD-10-CM

## 2016-12-02 DIAGNOSIS — Z808 Family history of malignant neoplasm of other organs or systems: Secondary | ICD-10-CM

## 2016-12-02 DIAGNOSIS — R634 Abnormal weight loss: Secondary | ICD-10-CM

## 2016-12-02 DIAGNOSIS — Z79899 Other long term (current) drug therapy: Secondary | ICD-10-CM

## 2016-12-02 DIAGNOSIS — K769 Liver disease, unspecified: Secondary | ICD-10-CM

## 2016-12-02 DIAGNOSIS — R918 Other nonspecific abnormal finding of lung field: Secondary | ICD-10-CM

## 2016-12-02 DIAGNOSIS — R5383 Other fatigue: Secondary | ICD-10-CM

## 2016-12-02 DIAGNOSIS — Z7982 Long term (current) use of aspirin: Secondary | ICD-10-CM

## 2016-12-02 LAB — CBC WITH DIFFERENTIAL/PLATELET
BASOS ABS: 0.1 10*3/uL (ref 0–0.1)
Basophils Relative: 0 %
Eosinophils Absolute: 0.2 10*3/uL (ref 0–0.7)
Eosinophils Relative: 1 %
HEMATOCRIT: 43.3 % (ref 35.0–47.0)
HEMOGLOBIN: 14.2 g/dL (ref 12.0–16.0)
LYMPHS PCT: 13 %
Lymphs Abs: 2.2 10*3/uL (ref 1.0–3.6)
MCH: 31.7 pg (ref 26.0–34.0)
MCHC: 32.7 g/dL (ref 32.0–36.0)
MCV: 97 fL (ref 80.0–100.0)
MONOS PCT: 10 %
Monocytes Absolute: 1.7 10*3/uL — ABNORMAL HIGH (ref 0.2–0.9)
NEUTROS ABS: 13.6 10*3/uL — AB (ref 1.4–6.5)
NEUTROS PCT: 76 %
Platelets: 250 10*3/uL (ref 150–440)
RBC: 4.47 MIL/uL (ref 3.80–5.20)
RDW: 15.3 % — ABNORMAL HIGH (ref 11.5–14.5)
WBC: 17.8 10*3/uL — ABNORMAL HIGH (ref 3.6–11.0)

## 2016-12-02 LAB — COMPREHENSIVE METABOLIC PANEL
ALBUMIN: 2.7 g/dL — AB (ref 3.5–5.0)
ALK PHOS: 672 U/L — AB (ref 38–126)
ALT: 77 U/L — ABNORMAL HIGH (ref 14–54)
AST: 196 U/L — AB (ref 15–41)
Anion gap: 21 — ABNORMAL HIGH (ref 5–15)
BILIRUBIN TOTAL: 6 mg/dL — AB (ref 0.3–1.2)
BUN: 41 mg/dL — AB (ref 6–20)
CALCIUM: 9.3 mg/dL (ref 8.9–10.3)
CO2: 12 mmol/L — ABNORMAL LOW (ref 22–32)
CREATININE: 1.55 mg/dL — AB (ref 0.44–1.00)
Chloride: 91 mmol/L — ABNORMAL LOW (ref 101–111)
GFR calc Af Amer: 41 mL/min — ABNORMAL LOW (ref >60)
GFR calc non Af Amer: 35 mL/min — ABNORMAL LOW (ref >60)
GLUCOSE: 71 mg/dL (ref 65–99)
Potassium: 5.2 mmol/L — ABNORMAL HIGH (ref 3.5–5.1)
Sodium: 124 mmol/L — ABNORMAL LOW (ref 135–145)
TOTAL PROTEIN: 6.1 g/dL — AB (ref 6.5–8.1)

## 2016-12-02 MED ORDER — PALONOSETRON HCL INJECTION 0.25 MG/5ML
0.2500 mg | Freq: Once | INTRAVENOUS | Status: AC
  Administered 2016-12-02: 0.25 mg via INTRAVENOUS
  Filled 2016-12-02: qty 5

## 2016-12-02 MED ORDER — MANNITOL 25 % IV SOLN
Freq: Once | INTRAVENOUS | Status: AC
  Administered 2016-12-02: 11:00:00 via INTRAVENOUS
  Filled 2016-12-02: qty 3

## 2016-12-02 MED ORDER — SODIUM CHLORIDE 0.9 % IV SOLN
80.0000 mg/m2 | Freq: Once | INTRAVENOUS | Status: AC
  Administered 2016-12-02: 138 mg via INTRAVENOUS
  Filled 2016-12-02: qty 100

## 2016-12-02 MED ORDER — HEPARIN SOD (PORK) LOCK FLUSH 100 UNIT/ML IV SOLN
500.0000 [IU] | Freq: Once | INTRAVENOUS | Status: DC | PRN
  Filled 2016-12-02 (×2): qty 5

## 2016-12-02 MED ORDER — SODIUM CHLORIDE 0.9 % IV SOLN
Freq: Once | INTRAVENOUS | Status: AC
  Administered 2016-12-02: 13:00:00 via INTRAVENOUS
  Filled 2016-12-02: qty 5

## 2016-12-02 MED ORDER — SUCRALFATE 1 G PO TABS: 1.0000 g | ORAL_TABLET | Freq: Two times a day (BID) | ORAL | 2 refills | Status: DC

## 2016-12-02 MED ORDER — HEPARIN SOD (PORK) LOCK FLUSH 100 UNIT/ML IV SOLN
500.0000 [IU] | Freq: Once | INTRAVENOUS | Status: AC
  Administered 2016-12-02: 500 [IU] via INTRAVENOUS

## 2016-12-02 MED ORDER — ETOPOSIDE CHEMO INJECTION 1 GM/50ML
50.0000 mg/m2 | Freq: Once | INTRAVENOUS | Status: AC
  Administered 2016-12-02: 90 mg via INTRAVENOUS
  Filled 2016-12-02: qty 4.5

## 2016-12-02 MED ORDER — SODIUM CHLORIDE 0.9% FLUSH
10.0000 mL | Freq: Once | INTRAVENOUS | Status: AC
  Administered 2016-12-02: 10 mL via INTRAVENOUS
  Filled 2016-12-02: qty 10

## 2016-12-02 MED ORDER — SODIUM CHLORIDE 0.9 % IV SOLN
Freq: Once | INTRAVENOUS | Status: AC
  Administered 2016-12-02: 13:00:00 via INTRAVENOUS
  Filled 2016-12-02: qty 1000

## 2016-12-02 MED ORDER — SODIUM CHLORIDE 0.9% FLUSH
10.0000 mL | INTRAVENOUS | Status: DC | PRN
  Administered 2016-12-02: 10 mL
  Filled 2016-12-02: qty 10

## 2016-12-02 NOTE — Progress Notes (Signed)
  Oncology Nurse Navigator Documentation  Navigator Location: [facility] (12/02/16 1200)   )Navigator Encounter Type: Treatment (12/02/16 1200)     Confirmed Diagnosis Date: 11/20/16 (12/02/16 1200)             Treatment Initiated Date: 12/02/16 (12/02/16 1200)   Treatment Phase: First Chemo Tx (12/02/16 1200) Barriers/Navigation Needs: No barriers at this time (12/02/16 1200)   Interventions: Coordination of Care;Psycho-social support (12/02/16 1200)   Coordination of Care: Appts (12/02/16 1200)         met with patient and family prior to first cycle chemotherapy. All questions answered during visit. Pt and family provided copy of recent PET scan and lab results from this morning along with personal lung cancer profile. Pt and family appeared upset after receiving news of poor prognosis. Support and encouragement provided. Pt and family instructed to call with any further questions or needs.         Time Spent with Patient: 30 (12/02/16 1200)

## 2016-12-02 NOTE — Progress Notes (Signed)
Patient here today for follow up.  Patient c/o heartburn, confusion and vomiting

## 2016-12-02 NOTE — Progress Notes (Signed)
Per MD.  Patient Na level =124 (due to disease), patients only option is to get treatment.  Will not get potassium in fluids today and will decrease etoposide dose by 50%

## 2016-12-03 ENCOUNTER — Inpatient Hospital Stay: Payer: 59

## 2016-12-03 ENCOUNTER — Ambulatory Visit
Admission: RE | Admit: 2016-12-03 | Discharge: 2016-12-03 | Disposition: A | Discharge status: Home / Self Care | Payer: 59 | Source: Ambulatory Visit | Attending: Radiation Oncology | Admitting: Radiation Oncology

## 2016-12-03 VITALS — BP 128/84 | HR 93 | Resp 18

## 2016-12-03 DIAGNOSIS — C7A8 Other malignant neuroendocrine tumors: Secondary | ICD-10-CM

## 2016-12-03 DIAGNOSIS — C7B8 Other secondary neuroendocrine tumors: Secondary | ICD-10-CM | POA: Diagnosis not present

## 2016-12-03 MED ORDER — SODIUM CHLORIDE 0.9 % IV SOLN
50.0000 mg/m2 | Freq: Once | INTRAVENOUS | Status: AC
  Administered 2016-12-03: 90 mg via INTRAVENOUS
  Filled 2016-12-03: qty 4.5

## 2016-12-03 MED ORDER — SODIUM CHLORIDE 0.9% FLUSH
10.0000 mL | INTRAVENOUS | Status: DC | PRN
Start: 2016-12-03 — End: 2016-12-03
  Administered 2016-12-03: 10 mL via INTRAVENOUS
  Filled 2016-12-03: qty 10

## 2016-12-03 MED ORDER — SODIUM CHLORIDE 0.9 % IV SOLN
Freq: Once | INTRAVENOUS | Status: AC
  Administered 2016-12-03: 14:00:00 via INTRAVENOUS
  Filled 2016-12-03: qty 1000

## 2016-12-03 MED ORDER — DEXAMETHASONE SODIUM PHOSPHATE 10 MG/ML IJ SOLN
10.0000 mg | Freq: Once | INTRAMUSCULAR | Status: AC
  Administered 2016-12-03: 10 mg via INTRAVENOUS

## 2016-12-03 MED ORDER — SODIUM CHLORIDE 0.9 % IV SOLN
10.0000 mg | Freq: Once | INTRAVENOUS | Status: DC
  Filled 2016-12-03: qty 1

## 2016-12-03 MED ORDER — HEPARIN SOD (PORK) LOCK FLUSH 100 UNIT/ML IV SOLN
500.0000 [IU] | Freq: Once | INTRAVENOUS | Status: AC
  Administered 2016-12-03: 500 [IU] via INTRAVENOUS

## 2016-12-04 ENCOUNTER — Inpatient Hospital Stay: Payer: 59

## 2016-12-04 ENCOUNTER — Ambulatory Visit
Admission: RE | Admit: 2016-12-04 | Discharge: 2016-12-04 | Disposition: A | Discharge status: Home / Self Care | Payer: 59 | Source: Ambulatory Visit | Attending: Radiation Oncology | Admitting: Radiation Oncology

## 2016-12-04 VITALS — BP 137/90 | HR 93 | Temp 97.0°F | Resp 18

## 2016-12-04 DIAGNOSIS — C7A8 Other malignant neuroendocrine tumors: Secondary | ICD-10-CM | POA: Diagnosis not present

## 2016-12-04 DIAGNOSIS — C7B8 Other secondary neuroendocrine tumors: Secondary | ICD-10-CM | POA: Diagnosis not present

## 2016-12-04 MED ORDER — SODIUM CHLORIDE 0.9 % IV SOLN
Freq: Once | INTRAVENOUS | Status: AC
  Administered 2016-12-04: 14:00:00 via INTRAVENOUS
  Filled 2016-12-04: qty 1000

## 2016-12-04 MED ORDER — SODIUM CHLORIDE 0.9 % IV SOLN
50.0000 mg/m2 | Freq: Once | INTRAVENOUS | Status: AC
  Administered 2016-12-04: 90 mg via INTRAVENOUS
  Filled 2016-12-04: qty 4.5

## 2016-12-04 MED ORDER — DEXAMETHASONE SODIUM PHOSPHATE 10 MG/ML IJ SOLN
10.0000 mg | Freq: Once | INTRAMUSCULAR | Status: AC
  Administered 2016-12-04: 10 mg via INTRAVENOUS
  Filled 2016-12-04: qty 1

## 2016-12-04 MED ORDER — PEGFILGRASTIM 6 MG/0.6ML ~~LOC~~ PSKT
6.0000 mg | PREFILLED_SYRINGE | Freq: Once | SUBCUTANEOUS | Status: AC
  Administered 2016-12-04: 6 mg via SUBCUTANEOUS
  Filled 2016-12-04: qty 0.6

## 2016-12-04 MED ORDER — HEPARIN SOD (PORK) LOCK FLUSH 100 UNIT/ML IV SOLN
500.0000 [IU] | Freq: Once | INTRAVENOUS | Status: AC | PRN
  Administered 2016-12-04: 500 [IU]

## 2016-12-05 ENCOUNTER — Ambulatory Visit
Admission: RE | Admit: 2016-12-05 | Discharge: 2016-12-05 | Disposition: A | Discharge status: Home / Self Care | Payer: 59 | Source: Ambulatory Visit | Attending: Radiation Oncology | Admitting: Radiation Oncology

## 2016-12-05 DIAGNOSIS — C7B8 Other secondary neuroendocrine tumors: Secondary | ICD-10-CM | POA: Diagnosis not present

## 2016-12-06 ENCOUNTER — Ambulatory Visit
Admission: RE | Admit: 2016-12-06 | Discharge: 2016-12-06 | Disposition: A | Discharge status: Home / Self Care | Payer: 59 | Source: Ambulatory Visit | Attending: Radiation Oncology | Admitting: Radiation Oncology

## 2016-12-06 DIAGNOSIS — C7B8 Other secondary neuroendocrine tumors: Secondary | ICD-10-CM | POA: Diagnosis not present

## 2016-12-08 NOTE — Progress Notes (Addendum)
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  INTERVAL HISTORY: Patient returns to clinic today for further evaluation and to assess her toleration of cycle 1, day 1 of cisplatin and etoposide. She admits to persistent nausea and vomiting which is now improving. She has a poor appetite and has lost weight. She has mouth pain and mild dysphasia. She does not complain of worsening confusion today. Patient states her pain is better controlled today. She has no focal neurologic complaints. She denies any recent fevers or illnesses. She denies any chest pain, shortness of breath, cough, or hemoptysis. She has no nausea, vomiting, constipation, or diarrhea. She has no urinary complaints. Patient otherwise feels well and offers no further specific complaints.  REVIEW OF SYSTEMS:   Review of Systems  Constitutional: Positive for malaise/fatigue and weight loss. Negative for fever.  Respiratory: Negative.  Negative for cough, hemoptysis and shortness of breath.   Cardiovascular: Negative.  Negative for chest pain and leg swelling.  Gastrointestinal: Positive for nausea and vomiting. Negative for abdominal pain, blood in stool and melena.  Genitourinary: Negative.  Negative for flank pain.  Skin: Negative.  Negative for rash.  Neurological: Positive for weakness. Negative for sensory change.  Psychiatric/Behavioral: Negative for memory loss. The patient is nervous/anxious.     As per HPI. Otherwise, a complete review of systems is negative.  PAST MEDICAL HISTORY: Past Medical History:  Diagnosis Date  . Coronary artery disease    Cardiac catheterization in January of 2015 showed mild nonobstructive  three-vessel coronary artery disease with significant mid LAD spasm which improved with nitroglycerin. Metoprolol was switched to diltiazem  . Hyperlipidemia   . Hypertension   . PSVT (paroxysmal supraventricular tachycardia) ([place])     PAST SURGICAL HISTORY: Past Surgical History:  Procedure Laterality Date  . CARDIAC CATHETERIZATION  06/2013   [facility]  . PORTA CATH INSERTION N/A 11/27/2016   Procedure: Glori Luis Cath Insertion;  Surgeon: Katha Cabal, MD;  Location: [place] [facility];  Service: Cardiovascular;  Laterality: N/A;  . SUPRAVENTRICULAR TACHYCARDIA ABLATION N/A 04/22/2014   Procedure: SUPRAVENTRICULAR TACHYCARDIA ABLATION;  Surgeon: Evans Lance, MD;  Location: [facility] CATH LAB;  Service: Cardiovascular;  Laterality: N/A;  . TONSILLECTOMY    . TUBAL LIGATION      FAMILY HISTORY: Family History  Problem Relation Age of Onset  . Stroke Mother   . Hypertension Mother   . Heart attack Father   . Thyroid cancer Sister   . Stroke Sister     ADVANCED DIRECTIVES (Y/N):  N  HEALTH MAINTENANCE: Social History  Substance Use Topics  . Smoking status: Current Some Day Smoker    Years: 30.00    Types: Cigarettes  . Smokeless tobacco: Never Used  . Alcohol use No     Colonoscopy:  PAP:  Bone density:  Lipid panel:  Allergies  Allergen Reactions  . Bee Venom Swelling  . Codeine Nausea And Vomiting    Pruritic rash  . Sulfa Antibiotics Other (See Comments)    Other reaction(s): Pruritic rash  . Penicillin G Rash    Has patient had a PCN reaction causing immediate rash, facial/tongue/throat swelling, SOB or lightheadedness with hypotension: Yes Has patient had a PCN reaction causing severe rash involving  mucus membranes or skin necrosis: No Has patient had a PCN reaction that required hospitalization No Has patient had a PCN reaction occurring within the last 10 years: No If all of the above answers are "NO", then may proceed with Cephalosporin use.   . Shellfish  Allergy Rash    Current Outpatient Prescriptions  Medication Sig Dispense Refill  . albuterol (PROVENTIL HFA;VENTOLIN HFA) 108 (90 BASE) MCG/ACT inhaler Inhale 2 puffs into the lungs every 4 (four) hours as needed for wheezing or shortness of breath.    . fentaNYL (DURAGESIC - DOSED MCG/HR) 25 MCG/HR patch Place 1 patch (25 mcg total) onto the skin every 3 (three) days. 10 patch 0  . lidocaine (LIDODERM) 5 % Place 1 patch onto the skin daily. Remove & Discard patch within 12 hours or as directed by MD 30 patch 0  . lidocaine-prilocaine (EMLA) cream Apply to affected area once 30 g 3  . lisinopril (PRINIVIL,ZESTRIL) 10 MG tablet Take 10 mg by mouth daily.    Marland Kitchen omeprazole (PRILOSEC) 20 MG capsule Take 40 mg by mouth daily.    . ondansetron (ZOFRAN) 8 MG tablet Take 1 tablet (8 mg total) by mouth 2 (two) times daily as needed. Start on the third day after cisplatin chemotherapy. 60 tablet 2  . prochlorperazine (COMPAZINE) 10 MG tablet Take 1 tablet (10 mg total) by mouth every 6 (six) hours as needed (Nausea or vomiting). 60 tablet 2  . sucralfate (CARAFATE) 1 g tablet Take 1 tablet (1 g total) by mouth 2 (two) times daily. Dissolve tablet in water then drink. 60 tablet 2  . traMADol (ULTRAM) 50 MG tablet Take 1 tablet (50 mg total) by mouth 3 (three) times daily as needed. (Patient not taking: Reported on 12/02/2016) 60 tablet 0   Current Facility-Administered Medications  Medication Dose Route Frequency Provider Last Rate Last Dose  . 0.9 %  sodium chloride infusion   Intravenous Once Lloyd Huger, MD      . calcium gluconate 2 g in sodium chloride 0.9 % 100 mL IVPB  2 g Intravenous Once Lloyd Huger, MD 60 mL/hr at 12/09/16 1229 2 g at 12/09/16 1229   Facility-Administered Medications Ordered in Other Visits  Medication Dose Route Frequency Provider Last Rate Last Dose  . 0.9 %  sodium chloride infusion   Intravenous Continuous Lloyd Huger, MD 999 mL/hr at 12/09/16 1215        OBJECTIVE: Vitals:   12/09/16 1127  BP: (!) 133/95  Pulse: (!) 127  Resp: 20  Temp: 97.6 F (36.4 C)     Body mass index is 21.75 kg/m.    ECOG FS:1 - Symptomatic but completely ambulatory  General: Well-developed, well-nourished, no acute distress. Eyes: Pink conjunctiva, anicteric sclera. Lungs: Clear to auscultation bilaterally. Heart: Regular rate and rhythm. No rubs, murmurs, or gallops. Abdomen: Soft, nontender, nondistended. No organomegaly noted, normoactive bowel sounds. Musculoskeletal: No edema, cyanosis, or clubbing. Neuro: Alert, answering all questions appropriately. Cranial nerves grossly intact. Skin: No rashes or petechiae noted. Psych: Normal affect.   LAB RESULTS:  Lab Results  Component Value Date   NA 130 (L) 12/09/2016   K 3.9 12/09/2016   CL 101 12/09/2016   CO2 16 (L) 12/09/2016   GLUCOSE 167 (H) 12/09/2016   BUN 59 (H) 12/09/2016   CREATININE 1.56 (H) 12/09/2016   CALCIUM 6.5 (L) 12/09/2016   PROT 6.2 (L) 12/09/2016   ALBUMIN 2.7 (L) 12/09/2016   AST 46 (H) 12/09/2016  ALT 38 12/09/2016   ALKPHOS 431 (H) 12/09/2016   BILITOT 2.2 (H) 12/09/2016   GFRNONAA 35 (L) 12/09/2016   GFRAA 41 (L) 12/09/2016    Lab Results  Component Value Date   WBC 11.3 (H) 12/09/2016   NEUTROABS 10.2 (H) 12/09/2016   HGB 14.9 12/09/2016   HCT 43.5 12/09/2016   MCV 91.2 12/09/2016   PLT 44 (L) 12/09/2016     STUDIES: Mr Jeri Cos KW Contrast  Result Date: 11/20/2016 CLINICAL DATA:  Headache. Dizziness. Balance disturbance. Weakness. Metastatic lung cancer. EXAM: MRI HEAD WITHOUT AND WITH CONTRAST TECHNIQUE: Multiplanar, multiecho pulse sequences of the brain and surrounding structures were obtained without and with intravenous contrast. CONTRAST:  53mL MULTIHANCE GADOBENATE DIMEGLUMINE 529 MG/ML IV SOLN COMPARISON:  None. FINDINGS: Brain: Diffusion imaging does not show any acute or subacute infarction. There are chronic small-vessel ischemic changes  affecting the pons and cerebral hemispheric white matter, moderate in degree and premature for age. No sign of primary or metastatic intracranial mass lesion, hemorrhage, hydrocephalus or extra-axial collection. No abnormal intracranial enhancement. Vascular: Major vessels at the base of the brain show flow. Skull and upper cervical spine: Indeterminate 8 mm marrow focus in the right inferior posterior parietal region. This could possibly be a bone metastasis but is nonspecific. Sinuses/Orbits: Clear/normal Other: None IMPRESSION: No evidence of intracranial metastatic disease or acute intracranial finding. Moderate chronic small-vessel ischemic changes of the brain, premature for age. 8 mm nonspecific focus within the right posterior inferior parietal calvarium. This could possibly be an early bone metastasis. Electronically Signed   By: Nelson Chimes M.D.   On: 11/20/2016 10:25   Nm Pet Image Initial (pi) Skull Base To Thigh  Result Date: 11/25/2016 CLINICAL DATA:  Initial treatment strategy for large cell neuroendocrine carcinoma. EXAM: NUCLEAR MEDICINE PET SKULL BASE TO THIGH TECHNIQUE: 12.6 mCi F-18 FDG was injected intravenously. Full-ring PET imaging was performed from the skull base to thigh after the radiotracer. CT data was obtained and used for attenuation correction and anatomic localization. FASTING BLOOD GLUCOSE:  Value: 86 mg/dl COMPARISON:  CT chest abdomen pelvis 11/07/2016. FINDINGS: NECK No hypermetabolic lymph nodes in the neck. CT images show no acute findings. CHEST There are hypermetabolic low right internal jugular, bilateral mediastinal and right hilar lymph nodes. Index right paratracheal lymph node measures 1.5 cm with an SUV max of 11.9. Perihilar right middle lobe spiculated mass measures approximately 3.2 cm with an SUV max of 3.8. 1.5 cm lingular nodule is hypermetabolic as well. Atherosclerotic calcification of the arterial vasculature, including coronary arteries. No pericardial  or pleural effusion. ABDOMEN/PELVIS Liver is completely replaced with ill-defined low-attenuation lesions and is diffusely hypermetabolic with an index SUV max of 17.7. There are hypermetabolic periportal lymph nodes as well. No abnormal hypermetabolism in the adrenal glands, spleen or pancreas. No additional hypermetabolic lymph nodes. Liver is markedly enlarged. Gallbladder, adrenal glands, kidneys and spleen are grossly unremarkable. Calcifications are seen in the pancreas. Stomach and small bowel are grossly unremarkable. Small pelvic free fluid. SKELETON Multiple hypermetabolic lesions are seen throughout the visualized osseous structures. Index hypermetabolic lesion in the central sacrum has an SUV max of 6.6. IMPRESSION: 1. Hypermetabolic right middle lobe mass and lingular nodule with hypermetabolic low internal jugular/mediastinal/right hilar adenopathy, diffuse hypermetabolic involvement of the liver, periportal adenopathy and multiple hypermetabolic osseous lesions, most consistent with stage IV primary bronchogenic carcinoma (large-cell neuroendocrine carcinoma by biopsy). 2. Small pelvic free fluid. 3. Aortic atherosclerosis (ICD10-170.0). Coronary artery calcification. 4. Chronic  calcific pancreatitis. Electronically Signed   By: Lorin Picket M.D.   On: 11/25/2016 10:37   US Biopsy  Result Date: 11/18/2016 CLINICAL DATA:  Bilateral pulmonary masses, skeletal metastatic disease and widespread metastatic disease throughout the liver. Referral for liver biopsy to establish tissue diagnosis. EXAM: ULTRASOUND GUIDED CORE BIOPSY OF LIVER MEDICATIONS: 2.0 mg IV Versed; 100 mcg IV Fentanyl Total Moderate Sedation Time: 18 minutes. The patient's level of consciousness and physiologic status were continuously monitored during the procedure by Radiology nursing. PROCEDURE: The procedure, risks, benefits, and alternatives were explained to the patient. Questions regarding the procedure were encouraged and  answered. The patient understands and consents to the procedure. A time out was performed prior to initiating the procedure. Ultrasound was used to localize liver lesions. The abdominal wall was prepped with chlorhexidine in a sterile fashion, and a sterile drape was applied covering the operative field. A sterile gown and sterile gloves were used for the procedure. Local anesthesia was provided with 1% Lidocaine. Under direct ultrasound guidance, a 17 gauge needle was advanced into the left lobe of the liver. After confirming needle tip position, coaxial 18 gauge core biopsy samples were performed with 3 samples obtained and submitted in formalin. Additional ultrasound was performed after outer needle removal. COMPLICATIONS: None. FINDINGS: Multitude of masses are seen throughout the liver parenchyma which are relatively hyperechoic relative to adjacent normal parenchyma. The left lobe was chosen for biopsy and a specific lesion measuring approximately 2.9 cm in greatest diameter was targeted. Solid tissue was obtained. IMPRESSION: Ultrasound-guided core biopsy performed within the left lobe of the liver at the level of a lesion. The liver is diffusely involved with a multitude of metastatic lesions. Electronically Signed   By: Aletta Edouard M.D.   On: 11/18/2016 12:50    ASSESSMENT: Stage IV large cell neuroendocrine carcinoma.  PLAN:    1. Stage IV large cell neuroendocrine carcinoma: PET scan results reviewed independently and reported as above with widespread lesions consistent with metastatic disease. Patient's AFP is normal, but the remainder of her tumor markers are significantly elevated. MRI of the brain is negative for metastatic disease. After lengthy discussion with the patient, she wishes to pursue aggressive treatment with chemotherapy using cisplatin and etoposide. Patient will also receive Neulasta support. Given patient's worsening liver function and elevated bilirubin, etoposide was dose  reduced 50%. Plan on 4 cycles every 3 weeks and then reimage. Patient received cycle 1, day 1 of cisplatin and etoposide last week and tolerated treatment relatively well. Return to clinic in 2 weeks for consideration of cycle 2. 2. Elevated liver enzymes/bilirubin: Likely secondary to progressive disease. Improving with chemotherapy last week. Will treat with dose reduced etoposide as above. 3. Hypocalcemia: Patient initially presented with hypercalcemia and received 4 mg of Zometa on November 21, 2016. Her calcium levels have now significantly dropped, therefore she will receive 2 g IV calcium gluconate today. Return to clinic in 1 week for repeat laboratory work and consideration of additional calcium gluconate 4. Right flank pain: Likely secondary to malignancy. Improved.  5. Disposition: Patient's poor prognosis as well as consideration of hospice and end-of-life care were discussed previously, but patient wishes to continue with aggressive treatment at this time. 6. Dehydration: Patient will also receive 1 L of IV fluids today. 7. Nausea/vomiting: Patient has been instructed to take her nausea medications as scheduled rather than as needed. 8. Mouth pain: Patient was given a prescription for Magic mouthwash.  Approximately 30 minutes was spent in  discussion of which greater than 50% was consultation.  Patient expressed understanding and was in agreement with this plan. She also understands that She can call clinic at any time with any questions, concerns, or complaints.   Cancer Staging No matching staging information was found for the patient.  Lloyd Huger, MD   12/09/2016 12:40 PM

## 2016-12-09 ENCOUNTER — Inpatient Hospital Stay: Payer: 59

## 2016-12-09 ENCOUNTER — Telehealth: Payer: Self-pay | Admitting: *Deleted

## 2016-12-09 ENCOUNTER — Inpatient Hospital Stay (HOSPITAL_BASED_OUTPATIENT_CLINIC_OR_DEPARTMENT_OTHER): Payer: 59 | Admitting: Oncology

## 2016-12-09 ENCOUNTER — Ambulatory Visit
Admission: RE | Admit: 2016-12-09 | Discharge: 2016-12-09 | Disposition: A | Discharge status: Home / Self Care | Payer: 59 | Source: Ambulatory Visit | Attending: Radiation Oncology | Admitting: Radiation Oncology

## 2016-12-09 VITALS — BP 129/84 | HR 101 | Resp 20

## 2016-12-09 VITALS — BP 133/95 | HR 127 | Temp 97.6°F | Resp 20 | Wt 130.7 lb

## 2016-12-09 DIAGNOSIS — N6310 Unspecified lump in the right breast, unspecified quadrant: Secondary | ICD-10-CM

## 2016-12-09 DIAGNOSIS — R531 Weakness: Secondary | ICD-10-CM

## 2016-12-09 DIAGNOSIS — I471 Supraventricular tachycardia: Secondary | ICD-10-CM

## 2016-12-09 DIAGNOSIS — Z808 Family history of malignant neoplasm of other organs or systems: Secondary | ICD-10-CM

## 2016-12-09 DIAGNOSIS — C7A8 Other malignant neuroendocrine tumors: Secondary | ICD-10-CM

## 2016-12-09 DIAGNOSIS — E785 Hyperlipidemia, unspecified: Secondary | ICD-10-CM

## 2016-12-09 DIAGNOSIS — Z7689 Persons encountering health services in other specified circumstances: Secondary | ICD-10-CM | POA: Diagnosis not present

## 2016-12-09 DIAGNOSIS — Z7982 Long term (current) use of aspirin: Secondary | ICD-10-CM

## 2016-12-09 DIAGNOSIS — R5383 Other fatigue: Secondary | ICD-10-CM

## 2016-12-09 DIAGNOSIS — N6320 Unspecified lump in the left breast, unspecified quadrant: Secondary | ICD-10-CM

## 2016-12-09 DIAGNOSIS — I2510 Atherosclerotic heart disease of native coronary artery without angina pectoris: Secondary | ICD-10-CM

## 2016-12-09 DIAGNOSIS — R748 Abnormal levels of other serum enzymes: Secondary | ICD-10-CM | POA: Diagnosis not present

## 2016-12-09 DIAGNOSIS — R918 Other nonspecific abnormal finding of lung field: Secondary | ICD-10-CM

## 2016-12-09 DIAGNOSIS — I700 Atherosclerosis of aorta: Secondary | ICD-10-CM

## 2016-12-09 DIAGNOSIS — F17210 Nicotine dependence, cigarettes, uncomplicated: Secondary | ICD-10-CM

## 2016-12-09 DIAGNOSIS — K1379 Other lesions of oral mucosa: Secondary | ICD-10-CM

## 2016-12-09 DIAGNOSIS — K769 Liver disease, unspecified: Secondary | ICD-10-CM

## 2016-12-09 DIAGNOSIS — C7B8 Other secondary neuroendocrine tumors: Secondary | ICD-10-CM | POA: Diagnosis not present

## 2016-12-09 DIAGNOSIS — R112 Nausea with vomiting, unspecified: Secondary | ICD-10-CM

## 2016-12-09 DIAGNOSIS — R634 Abnormal weight loss: Secondary | ICD-10-CM

## 2016-12-09 DIAGNOSIS — R109 Unspecified abdominal pain: Secondary | ICD-10-CM

## 2016-12-09 DIAGNOSIS — Z79899 Other long term (current) drug therapy: Secondary | ICD-10-CM

## 2016-12-09 DIAGNOSIS — E860 Dehydration: Secondary | ICD-10-CM

## 2016-12-09 DIAGNOSIS — I10 Essential (primary) hypertension: Secondary | ICD-10-CM

## 2016-12-09 LAB — COMPREHENSIVE METABOLIC PANEL
ALT: 38 U/L (ref 14–54)
ANION GAP: 13 (ref 5–15)
AST: 46 U/L — AB (ref 15–41)
Albumin: 2.7 g/dL — ABNORMAL LOW (ref 3.5–5.0)
Alkaline Phosphatase: 431 U/L — ABNORMAL HIGH (ref 38–126)
BILIRUBIN TOTAL: 2.2 mg/dL — AB (ref 0.3–1.2)
BUN: 59 mg/dL — AB (ref 6–20)
CO2: 16 mmol/L — ABNORMAL LOW (ref 22–32)
Calcium: 6.5 mg/dL — ABNORMAL LOW (ref 8.9–10.3)
Chloride: 101 mmol/L (ref 101–111)
Creatinine, Ser: 1.56 mg/dL — ABNORMAL HIGH (ref 0.44–1.00)
GFR, EST AFRICAN AMERICAN: 41 mL/min — AB (ref >60)
GFR, EST NON AFRICAN AMERICAN: 35 mL/min — AB (ref >60)
Glucose, Bld: 167 mg/dL — ABNORMAL HIGH (ref 65–99)
Potassium: 3.9 mmol/L (ref 3.5–5.1)
Sodium: 130 mmol/L — ABNORMAL LOW (ref 135–145)
TOTAL PROTEIN: 6.2 g/dL — AB (ref 6.5–8.1)

## 2016-12-09 LAB — CBC WITH DIFFERENTIAL/PLATELET
Basophils Absolute: 0 10*3/uL (ref 0–0.1)
Basophils Relative: 0 %
EOS PCT: 1 %
Eosinophils Absolute: 0.1 10*3/uL (ref 0–0.7)
HEMATOCRIT: 43.5 % (ref 35.0–47.0)
Hemoglobin: 14.9 g/dL (ref 12.0–16.0)
LYMPHS ABS: 0.7 10*3/uL — AB (ref 1.0–3.6)
LYMPHS PCT: 7 %
MCH: 31.3 pg (ref 26.0–34.0)
MCHC: 34.3 g/dL (ref 32.0–36.0)
MCV: 91.2 fL (ref 80.0–100.0)
MONO ABS: 0.2 10*3/uL (ref 0.2–0.9)
MONOS PCT: 2 %
Neutro Abs: 10.2 10*3/uL — ABNORMAL HIGH (ref 1.4–6.5)
Neutrophils Relative %: 90 %
PLATELETS: 44 10*3/uL — AB (ref 150–440)
RBC: 4.77 MIL/uL (ref 3.80–5.20)
RDW: 15.6 % — AB (ref 11.5–14.5)
WBC: 11.3 10*3/uL — ABNORMAL HIGH (ref 3.6–11.0)

## 2016-12-09 MED ORDER — HEPARIN SOD (PORK) LOCK FLUSH 100 UNIT/ML IV SOLN
INTRAVENOUS | Status: AC
  Filled 2016-12-09: qty 5

## 2016-12-09 MED ORDER — SODIUM CHLORIDE 0.9 % IV SOLN
Freq: Once | INTRAVENOUS | Status: DC
  Filled 2016-12-09: qty 1000

## 2016-12-09 MED ORDER — SODIUM CHLORIDE 0.9 % IV SOLN: 10.0000 mg | Freq: Once | INTRAVENOUS | Status: DC

## 2016-12-09 MED ORDER — DEXAMETHASONE SODIUM PHOSPHATE 10 MG/ML IJ SOLN
10.0000 mg | Freq: Once | INTRAMUSCULAR | Status: AC
  Administered 2016-12-09: 10 mg via INTRAVENOUS
  Filled 2016-12-09: qty 1

## 2016-12-09 MED ORDER — SODIUM CHLORIDE 0.9 % IV SOLN
2.0000 g | Freq: Once | INTRAVENOUS | Status: AC
  Administered 2016-12-09: 2 g via INTRAVENOUS
  Filled 2016-12-09: qty 20

## 2016-12-09 MED ORDER — SODIUM CHLORIDE 0.9 % IV SOLN
1.0000 g | Freq: Once | INTRAVENOUS | Status: DC
  Filled 2016-12-09: qty 10

## 2016-12-09 MED ORDER — ONDANSETRON HCL 4 MG PO TABS
8.0000 mg | ORAL_TABLET | Freq: Once | ORAL | Status: AC
  Administered 2016-12-09: 8 mg via ORAL
  Filled 2016-12-09: qty 2

## 2016-12-09 MED ORDER — MAGIC MOUTHWASH W/LIDOCAINE: 5.0000 mL | Freq: Three times a day (TID) | ORAL | 2 refills | Status: DC | PRN

## 2016-12-09 MED ORDER — SODIUM CHLORIDE 0.9 % IV SOLN
INTRAVENOUS | Status: DC
  Administered 2016-12-09: 12:00:00 via INTRAVENOUS
  Filled 2016-12-09 (×2): qty 1000

## 2016-12-09 NOTE — Telephone Encounter (Signed)
[name] called to check status of [facility] mouthwash prescription. Caryl Pina wanted to make sure it gets called in so they can get it on the way home. Informed pt's daughter that it will be called in to the pharmacy in a few minutes. Caryl Pina verbalized understanding.

## 2016-12-09 NOTE — Progress Notes (Signed)
Patient reports nausea this morning, mouth sores.

## 2016-12-10 ENCOUNTER — Ambulatory Visit
Admission: RE | Admit: 2016-12-10 | Discharge: 2016-12-10 | Disposition: A | Discharge status: Home / Self Care | Payer: 59 | Source: Ambulatory Visit | Attending: Radiation Oncology | Admitting: Radiation Oncology

## 2016-12-10 DIAGNOSIS — C7B8 Other secondary neuroendocrine tumors: Secondary | ICD-10-CM | POA: Diagnosis not present

## 2016-12-11 ENCOUNTER — Ambulatory Visit
Admission: RE | Admit: 2016-12-11 | Discharge: 2016-12-11 | Disposition: A | Discharge status: Home / Self Care | Payer: 59 | Source: Ambulatory Visit | Attending: Radiation Oncology | Admitting: Radiation Oncology

## 2016-12-11 DIAGNOSIS — C7B8 Other secondary neuroendocrine tumors: Secondary | ICD-10-CM | POA: Diagnosis not present

## 2016-12-12 ENCOUNTER — Ambulatory Visit
Admission: RE | Admit: 2016-12-12 | Discharge: 2016-12-12 | Disposition: A | Discharge status: Home / Self Care | Payer: 59 | Source: Ambulatory Visit | Attending: Radiation Oncology | Admitting: Radiation Oncology

## 2016-12-12 DIAGNOSIS — C7B8 Other secondary neuroendocrine tumors: Secondary | ICD-10-CM | POA: Diagnosis not present

## 2016-12-13 ENCOUNTER — Ambulatory Visit
Admission: RE | Admit: 2016-12-13 | Discharge: 2016-12-13 | Disposition: A | Discharge status: Home / Self Care | Payer: 59 | Source: Ambulatory Visit | Attending: Radiation Oncology | Admitting: Radiation Oncology

## 2016-12-13 DIAGNOSIS — C7B8 Other secondary neuroendocrine tumors: Secondary | ICD-10-CM | POA: Diagnosis not present

## 2016-12-16 ENCOUNTER — Inpatient Hospital Stay: Payer: 59

## 2016-12-16 ENCOUNTER — Ambulatory Visit
Admission: RE | Admit: 2016-12-16 | Discharge: 2016-12-16 | Disposition: A | Discharge status: Home / Self Care | Payer: 59 | Source: Ambulatory Visit | Attending: Radiation Oncology | Admitting: Radiation Oncology

## 2016-12-16 ENCOUNTER — Inpatient Hospital Stay: Payer: 59 | Attending: Oncology

## 2016-12-16 VITALS — BP 130/83 | HR 93 | Temp 96.8°F | Resp 20

## 2016-12-16 DIAGNOSIS — I10 Essential (primary) hypertension: Secondary | ICD-10-CM | POA: Diagnosis not present

## 2016-12-16 DIAGNOSIS — I700 Atherosclerosis of aorta: Secondary | ICD-10-CM | POA: Diagnosis not present

## 2016-12-16 DIAGNOSIS — C7B8 Other secondary neuroendocrine tumors: Secondary | ICD-10-CM | POA: Diagnosis not present

## 2016-12-16 DIAGNOSIS — E785 Hyperlipidemia, unspecified: Secondary | ICD-10-CM | POA: Insufficient documentation

## 2016-12-16 DIAGNOSIS — Z79899 Other long term (current) drug therapy: Secondary | ICD-10-CM | POA: Diagnosis not present

## 2016-12-16 DIAGNOSIS — Z5111 Encounter for antineoplastic chemotherapy: Secondary | ICD-10-CM | POA: Diagnosis not present

## 2016-12-16 DIAGNOSIS — C799 Secondary malignant neoplasm of unspecified site: Secondary | ICD-10-CM

## 2016-12-16 DIAGNOSIS — I2510 Atherosclerotic heart disease of native coronary artery without angina pectoris: Secondary | ICD-10-CM | POA: Insufficient documentation

## 2016-12-16 DIAGNOSIS — C7A8 Other malignant neuroendocrine tumors: Secondary | ICD-10-CM

## 2016-12-16 DIAGNOSIS — K1379 Other lesions of oral mucosa: Secondary | ICD-10-CM | POA: Diagnosis not present

## 2016-12-16 DIAGNOSIS — Z7689 Persons encountering health services in other specified circumstances: Secondary | ICD-10-CM | POA: Insufficient documentation

## 2016-12-16 DIAGNOSIS — R42 Dizziness and giddiness: Secondary | ICD-10-CM

## 2016-12-16 DIAGNOSIS — G893 Neoplasm related pain (acute) (chronic): Secondary | ICD-10-CM | POA: Insufficient documentation

## 2016-12-16 DIAGNOSIS — K861 Other chronic pancreatitis: Secondary | ICD-10-CM | POA: Diagnosis not present

## 2016-12-16 DIAGNOSIS — M25569 Pain in unspecified knee: Secondary | ICD-10-CM | POA: Insufficient documentation

## 2016-12-16 DIAGNOSIS — F17210 Nicotine dependence, cigarettes, uncomplicated: Secondary | ICD-10-CM | POA: Insufficient documentation

## 2016-12-16 DIAGNOSIS — R748 Abnormal levels of other serum enzymes: Secondary | ICD-10-CM | POA: Insufficient documentation

## 2016-12-16 DIAGNOSIS — C7A1 Malignant poorly differentiated neuroendocrine tumors: Secondary | ICD-10-CM

## 2016-12-16 DIAGNOSIS — C801 Malignant (primary) neoplasm, unspecified: Secondary | ICD-10-CM

## 2016-12-16 DIAGNOSIS — I471 Supraventricular tachycardia: Secondary | ICD-10-CM | POA: Insufficient documentation

## 2016-12-16 LAB — COMPREHENSIVE METABOLIC PANEL
ALBUMIN: 2.9 g/dL — AB (ref 3.5–5.0)
ALT: 26 U/L (ref 14–54)
AST: 40 U/L (ref 15–41)
Alkaline Phosphatase: 472 U/L — ABNORMAL HIGH (ref 38–126)
Anion gap: 11 (ref 5–15)
BUN: 11 mg/dL (ref 6–20)
CHLORIDE: 97 mmol/L — AB (ref 101–111)
CO2: 25 mmol/L (ref 22–32)
Calcium: 6.3 mg/dL — CL (ref 8.9–10.3)
Creatinine, Ser: 0.71 mg/dL (ref 0.44–1.00)
GFR calc Af Amer: >60 mL/min (ref >60)
Glucose, Bld: 101 mg/dL — ABNORMAL HIGH (ref 65–99)
POTASSIUM: 3.2 mmol/L — AB (ref 3.5–5.1)
Sodium: 133 mmol/L — ABNORMAL LOW (ref 135–145)
Total Bilirubin: 1.2 mg/dL (ref 0.3–1.2)
Total Protein: 6.1 g/dL — ABNORMAL LOW (ref 6.5–8.1)

## 2016-12-16 LAB — CBC WITH DIFFERENTIAL/PLATELET
BASOS ABS: 0.1 10*3/uL (ref 0–0.1)
BASOS PCT: 0 %
EOS PCT: 0 %
Eosinophils Absolute: 0 10*3/uL (ref 0–0.7)
HCT: 38.1 % (ref 35.0–47.0)
Hemoglobin: 13.1 g/dL (ref 12.0–16.0)
Lymphocytes Relative: 7 %
Lymphs Abs: 1.5 10*3/uL (ref 1.0–3.6)
MCH: 31.1 pg (ref 26.0–34.0)
MCHC: 34.4 g/dL (ref 32.0–36.0)
MCV: 90.5 fL (ref 80.0–100.0)
MONO ABS: 0.9 10*3/uL (ref 0.2–0.9)
Monocytes Relative: 4 %
Neutro Abs: 19.7 10*3/uL — ABNORMAL HIGH (ref 1.4–6.5)
Neutrophils Relative %: 89 %
PLATELETS: 137 10*3/uL — AB (ref 150–440)
RBC: 4.22 MIL/uL (ref 3.80–5.20)
RDW: 16 % — AB (ref 11.5–14.5)
WBC: 22.2 10*3/uL — ABNORMAL HIGH (ref 3.6–11.0)

## 2016-12-16 MED ORDER — FENTANYL 25 MCG/HR TD PT72: 25.0000 ug | MEDICATED_PATCH | TRANSDERMAL | 0 refills | Status: DC

## 2016-12-16 MED ORDER — SODIUM CHLORIDE 0.9 % IV SOLN
INTRAVENOUS | Status: DC
  Administered 2016-12-16: 10:00:00 via INTRAVENOUS
  Filled 2016-12-16: qty 1000

## 2016-12-16 MED ORDER — SODIUM CHLORIDE 0.9 % IV SOLN
2.0000 g | Freq: Once | INTRAVENOUS | Status: AC
  Administered 2016-12-16: 2 g via INTRAVENOUS
  Filled 2016-12-16: qty 20

## 2016-12-16 MED ORDER — SODIUM CHLORIDE 0.9% FLUSH
10.0000 mL | INTRAVENOUS | Status: DC | PRN
  Administered 2016-12-16: 10 mL via INTRAVENOUS
  Filled 2016-12-16: qty 10

## 2016-12-16 MED ORDER — PROCHLORPERAZINE MALEATE 10 MG PO TABS: 10.0000 mg | ORAL_TABLET | Freq: Four times a day (QID) | ORAL | 2 refills | Status: DC | PRN

## 2016-12-16 MED ORDER — HEPARIN SOD (PORK) LOCK FLUSH 100 UNIT/ML IV SOLN
500.0000 [IU] | Freq: Once | INTRAVENOUS | Status: AC
  Administered 2016-12-16: 500 [IU] via INTRAVENOUS

## 2016-12-16 MED ORDER — ONDANSETRON HCL 8 MG PO TABS: 8.0000 mg | ORAL_TABLET | Freq: Two times a day (BID) | ORAL | 2 refills | Status: AC | PRN

## 2016-12-16 MED ORDER — SODIUM CHLORIDE 0.9 % IV SOLN
Freq: Once | INTRAVENOUS | Status: AC
  Administered 2016-12-16: 10:00:00 via INTRAVENOUS
  Filled 2016-12-16: qty 1000

## 2016-12-17 ENCOUNTER — Ambulatory Visit
Admission: RE | Admit: 2016-12-17 | Discharge: 2016-12-17 | Disposition: A | Discharge status: Home / Self Care | Payer: 59 | Source: Ambulatory Visit | Attending: Radiation Oncology | Admitting: Radiation Oncology

## 2016-12-17 DIAGNOSIS — C7B8 Other secondary neuroendocrine tumors: Secondary | ICD-10-CM | POA: Diagnosis not present

## 2016-12-20 NOTE — Progress Notes (Signed)
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  INTERVAL HISTORY: Patient returns to clinic today for further evaluation and consideration of cycle 2, day 1 of cisplatin and etoposide. She currently feels well and is back to her baseline. She recently tripped and fell injuring her knee. Her appetite has improved and she is gaining weight. She has no neurologic complaints. She denies any recent fevers or illnesses. She denies any chest pain, shortness of breath, cough, or hemoptysis. She has no nausea, vomiting, constipation, or diarrhea. She has no urinary complaints. Patient otherwise feels well and offers no further specific complaints.  REVIEW OF SYSTEMS:   Review of Systems  Constitutional: Negative for fever, malaise/fatigue and weight loss.  Respiratory: Negative.  Negative for cough, hemoptysis and shortness of breath.   Cardiovascular: Negative.  Negative for chest pain and leg swelling.  Gastrointestinal: Negative for abdominal pain, blood in stool, melena, nausea and vomiting.  Genitourinary: Negative.  Negative for flank pain.  Musculoskeletal: Positive for joint pain.  Skin: Negative.  Negative for rash.  Neurological: Negative for sensory change and weakness.  Psychiatric/Behavioral: Negative.  Negative for memory loss. The patient is not nervous/anxious.     As per HPI. Otherwise, a complete review of systems is negative.  PAST MEDICAL HISTORY: Past Medical History:  Diagnosis Date  . Coronary artery disease    Cardiac catheterization in January of 2015 showed mild nonobstructive three-vessel coronary artery disease with significant mid LAD spasm which improved with  nitroglycerin. Metoprolol was switched to diltiazem  . Hyperlipidemia   . Hypertension   . PSVT (paroxysmal supraventricular tachycardia) ([place])     PAST SURGICAL HISTORY: Past Surgical History:  Procedure Laterality Date  . CARDIAC CATHETERIZATION  06/2013   [facility]  . PORTA CATH INSERTION N/A 11/27/2016   Procedure: Glori Luis Cath Insertion;  Surgeon: Katha Cabal, MD;  Location: [place] [facility];  Service: Cardiovascular;  Laterality: N/A;  . SUPRAVENTRICULAR TACHYCARDIA ABLATION N/A 04/22/2014   Procedure: SUPRAVENTRICULAR TACHYCARDIA ABLATION;  Surgeon: Evans Lance, MD;  Location: [facility] CATH LAB;  Service: Cardiovascular;  Laterality: N/A;  . TONSILLECTOMY    . TUBAL LIGATION      FAMILY HISTORY: Family History  Problem Relation Age of Onset  . Stroke Mother   . Hypertension Mother   . Heart attack Father   . Thyroid cancer Sister   . Stroke Sister     ADVANCED DIRECTIVES (Y/N):  N  HEALTH MAINTENANCE: Social History  Substance Use Topics  . Smoking status: Current Some Day Smoker    Years: 30.00    Types: Cigarettes  . Smokeless tobacco: Never Used  . Alcohol use No     Colonoscopy:  PAP:  Bone density:  Lipid panel:  Allergies  Allergen Reactions  . Bee Venom Swelling  . Codeine Nausea And Vomiting    Pruritic rash  . Sulfa Antibiotics Other (See Comments)    Other reaction(s): Pruritic rash  . Penicillin G Rash    Has patient had a PCN reaction causing immediate rash, facial/tongue/throat swelling, SOB or lightheadedness with hypotension: Yes Has patient had a PCN reaction causing severe rash involving mucus membranes or skin necrosis: No Has patient had a PCN reaction that required hospitalization No Has  patient had a PCN reaction occurring within the last 10 years: No If all of the above answers are "NO", then may proceed with Cephalosporin use.   . Shellfish Allergy Rash    Current Outpatient Prescriptions  Medication Sig Dispense Refill  .  albuterol (PROVENTIL HFA;VENTOLIN HFA) 108 (90 BASE) MCG/ACT inhaler Inhale 2 puffs into the lungs every 4 (four) hours as needed for wheezing or shortness of breath.    Marland Kitchen ibuprofen (ADVIL,MOTRIN) 200 MG tablet Take 200 mg by mouth every 6 (six) hours as needed.    . lidocaine (LIDODERM) 5 % Place 1 patch onto the skin daily. Remove & Discard patch within 12 hours or as directed by MD 30 patch 0  . lidocaine-prilocaine (EMLA) cream Apply to affected area once 30 g 3  . lisinopril (PRINIVIL,ZESTRIL) 10 MG tablet Take 10 mg by mouth daily.    . magic mouthwash w/lidocaine SOLN Take 5 mLs by mouth 3 (three) times daily as needed. May swish and swallow. 300 mL 2  . omeprazole (PRILOSEC) 20 MG capsule Take 40 mg by mouth daily.    . ondansetron (ZOFRAN) 8 MG tablet Take 1 tablet (8 mg total) by mouth 2 (two) times daily as needed. Start on the third day after cisplatin chemotherapy. 60 tablet 2  . prochlorperazine (COMPAZINE) 10 MG tablet Take 1 tablet (10 mg total) by mouth every 6 (six) hours as needed (Nausea or vomiting). 60 tablet 2  . sucralfate (CARAFATE) 1 g tablet Take 1 tablet (1 g total) by mouth 2 (two) times daily. Dissolve tablet in water then drink. 60 tablet 2  . fentaNYL (DURAGESIC - DOSED MCG/HR) 25 MCG/HR patch Place 1 patch (25 mcg total) onto the skin every 3 (three) days. (Patient not taking: Reported on 12/23/2016) 10 patch 0  . traMADol (ULTRAM) 50 MG tablet Take 1 tablet (50 mg total) by mouth 3 (three) times daily as needed. (Patient not taking: Reported on 12/02/2016) 60 tablet 0   No current facility-administered medications for this visit.    Facility-Administered Medications Ordered in Other Visits  Medication Dose Route Frequency Provider Last Rate Last Dose  . CISplatin (PLATINOL) 138 mg in sodium chloride 0.9 % 500 mL chemo infusion  80 mg/m2 (Treatment Plan Recorded) Intravenous Once Lloyd Huger, MD      . etoposide (VEPESID) 90 mg in sodium chloride 0.9 % 250 mL  chemo infusion  50 mg/m2 (Treatment Plan Recorded) Intravenous Once Lloyd Huger, MD      . fosaprepitant (EMEND) 150 mg, dexamethasone (DECADRON) 12 mg in sodium chloride 0.9 % 145 mL IVPB   Intravenous Once Lloyd Huger, MD      . heparin lock flush 100 unit/mL  500 Units Intravenous Once Lloyd Huger, MD      . palonosetron (ALOXI) injection 0.25 mg  0.25 mg Intravenous Once Lloyd Huger, MD      . sodium chloride flush (NS) 0.9 % injection 10 mL  10 mL Intravenous PRN Lloyd Huger, MD   10 mL at 12/23/16 0831  . Zoledronic Acid (ZOMETA) IVPB 4 mg  4 mg Intravenous Once Lloyd Huger, MD        OBJECTIVE: Vitals:   12/23/16 0850  BP: 127/87  Pulse: (!) 127  Resp: 20  Temp: 98.4 F (36.9 C)     Body mass index is 22.68 kg/m.    ECOG FS:1 - Symptomatic but completely ambulatory  General: Well-developed, well-nourished, no acute distress.  Eyes: Pink conjunctiva, anicteric sclera. Lungs: Clear to auscultation bilaterally. Heart: Regular rate and rhythm. No rubs, murmurs, or gallops. Abdomen: Soft, nontender, nondistended. No organomegaly noted, normoactive bowel sounds. Musculoskeletal: No edema, cyanosis, or clubbing. Neuro: Alert, answering all questions appropriately. Cranial nerves grossly intact. Skin: No rashes or petechiae noted. Psych: Normal affect.   LAB RESULTS:  Lab Results  Component Value Date   NA 138 12/23/2016   K 3.7 12/23/2016   CL 104 12/23/2016   CO2 22 12/23/2016   GLUCOSE 112 (H) 12/23/2016   BUN 10 12/23/2016   CREATININE 0.82 12/23/2016   CALCIUM 8.2 (L) 12/23/2016   PROT 6.2 (L) 12/23/2016   ALBUMIN 2.6 (L) 12/23/2016   AST 84 (H) 12/23/2016   ALT 39 12/23/2016   ALKPHOS 474 (H) 12/23/2016   BILITOT 0.8 12/23/2016   GFRNONAA >60 12/23/2016   GFRAA >60 12/23/2016    Lab Results  Component Value Date   WBC 15.1 (H) 12/23/2016   NEUTROABS 12.5 (H) 12/23/2016   HGB 12.0 12/23/2016   HCT 34.5 (L)  12/23/2016   MCV 91.7 12/23/2016   PLT 355 12/23/2016     STUDIES: Nm Pet Image Initial (pi) Skull Base To Thigh  Result Date: 11/25/2016 CLINICAL DATA:  Initial treatment strategy for large cell neuroendocrine carcinoma. EXAM: NUCLEAR MEDICINE PET SKULL BASE TO THIGH TECHNIQUE: 12.6 mCi F-18 FDG was injected intravenously. Full-ring PET imaging was performed from the skull base to thigh after the radiotracer. CT data was obtained and used for attenuation correction and anatomic localization. FASTING BLOOD GLUCOSE:  Value: 86 mg/dl COMPARISON:  CT chest abdomen pelvis 11/07/2016. FINDINGS: NECK No hypermetabolic lymph nodes in the neck. CT images show no acute findings. CHEST There are hypermetabolic low right internal jugular, bilateral mediastinal and right hilar lymph nodes. Index right paratracheal lymph node measures 1.5 cm with an SUV max of 11.9. Perihilar right middle lobe spiculated mass measures approximately 3.2 cm with an SUV max of 3.8. 1.5 cm lingular nodule is hypermetabolic as well. Atherosclerotic calcification of the arterial vasculature, including coronary arteries. No pericardial or pleural effusion. ABDOMEN/PELVIS Liver is completely replaced with ill-defined low-attenuation lesions and is diffusely hypermetabolic with an index SUV max of 17.7. There are hypermetabolic periportal lymph nodes as well. No abnormal hypermetabolism in the adrenal glands, spleen or pancreas. No additional hypermetabolic lymph nodes. Liver is markedly enlarged. Gallbladder, adrenal glands, kidneys and spleen are grossly unremarkable. Calcifications are seen in the pancreas. Stomach and small bowel are grossly unremarkable. Small pelvic free fluid. SKELETON Multiple hypermetabolic lesions are seen throughout the visualized osseous structures. Index hypermetabolic lesion in the central sacrum has an SUV max of 6.6. IMPRESSION: 1. Hypermetabolic right middle lobe mass and lingular nodule with hypermetabolic low  internal jugular/mediastinal/right hilar adenopathy, diffuse hypermetabolic involvement of the liver, periportal adenopathy and multiple hypermetabolic osseous lesions, most consistent with stage IV primary bronchogenic carcinoma (large-cell neuroendocrine carcinoma by biopsy). 2. Small pelvic free fluid. 3. Aortic atherosclerosis (ICD10-170.0). Coronary artery calcification. 4. Chronic calcific pancreatitis. Electronically Signed   By: Lorin Picket M.D.   On: 11/25/2016 10:37    ASSESSMENT: Stage IV large cell neuroendocrine carcinoma.  PLAN:    1. Stage IV large cell neuroendocrine carcinoma: PET scan results reviewed independently with widespread lesions consistent with metastatic disease. Patient's AFP is normal, but the remainder of her tumor markers are significantly elevated. MRI of the brain is negative for metastatic disease. After lengthy discussion with the patient, she wishes to pursue  aggressive treatment with chemotherapy using cisplatin and etoposide. Patient will also receive Neulasta support. Although patient's liver function has returned to normal, will continue with dose reduced etoposide for cycle 2. If patient tolerates treatment well, will increase dose for cycle 3. Plan on 4 cycles every 3 weeks and then reimage. Proceed with cycle 2, day 1 of cisplatin and etoposide today. Return to clinic in 1 and 2 days for etoposide only and then in 3 weeks for consideration of cycle 3. 2. Elevated liver enzymes/bilirubin: Resolved. Likely secondary to progressive disease.  3. Hypocalcemia: Resolved. 4. Right flank pain: Likely secondary to malignancy. Improved.  5. Disposition: Patient's poor prognosis as well as consideration of hospice and end-of-life care were previously discussed previously, but patient wishes to continue with aggressive treatment at this time. 6. Mouth pain: Patient was previously given a prescription for Magic mouthwash. 7. Knee pain: Secondary to fall.  Monitor.   Patient expressed understanding and was in agreement with this plan. She also understands that She can call clinic at any time with any questions, concerns, or complaints.   Cancer Staging No matching staging information was found for the patient.  Lloyd Huger, MD   12/23/2016 10:03 AM

## 2016-12-23 ENCOUNTER — Inpatient Hospital Stay (HOSPITAL_BASED_OUTPATIENT_CLINIC_OR_DEPARTMENT_OTHER): Payer: 59 | Admitting: Oncology

## 2016-12-23 ENCOUNTER — Inpatient Hospital Stay: Payer: 59

## 2016-12-23 VITALS — BP 127/87 | HR 127 | Temp 98.4°F | Resp 20 | Wt 136.3 lb

## 2016-12-23 DIAGNOSIS — Z7689 Persons encountering health services in other specified circumstances: Secondary | ICD-10-CM | POA: Diagnosis not present

## 2016-12-23 DIAGNOSIS — C7A8 Other malignant neuroendocrine tumors: Secondary | ICD-10-CM

## 2016-12-23 DIAGNOSIS — I700 Atherosclerosis of aorta: Secondary | ICD-10-CM | POA: Diagnosis not present

## 2016-12-23 DIAGNOSIS — K861 Other chronic pancreatitis: Secondary | ICD-10-CM | POA: Diagnosis not present

## 2016-12-23 DIAGNOSIS — E785 Hyperlipidemia, unspecified: Secondary | ICD-10-CM

## 2016-12-23 DIAGNOSIS — Z79899 Other long term (current) drug therapy: Secondary | ICD-10-CM

## 2016-12-23 DIAGNOSIS — M25569 Pain in unspecified knee: Secondary | ICD-10-CM | POA: Diagnosis not present

## 2016-12-23 DIAGNOSIS — I10 Essential (primary) hypertension: Secondary | ICD-10-CM | POA: Diagnosis not present

## 2016-12-23 DIAGNOSIS — F17210 Nicotine dependence, cigarettes, uncomplicated: Secondary | ICD-10-CM | POA: Diagnosis not present

## 2016-12-23 DIAGNOSIS — K1379 Other lesions of oral mucosa: Secondary | ICD-10-CM

## 2016-12-23 DIAGNOSIS — G893 Neoplasm related pain (acute) (chronic): Secondary | ICD-10-CM | POA: Diagnosis not present

## 2016-12-23 DIAGNOSIS — I471 Supraventricular tachycardia: Secondary | ICD-10-CM | POA: Diagnosis not present

## 2016-12-23 DIAGNOSIS — I2510 Atherosclerotic heart disease of native coronary artery without angina pectoris: Secondary | ICD-10-CM | POA: Diagnosis not present

## 2016-12-23 LAB — CBC WITH DIFFERENTIAL/PLATELET
BASOS ABS: 0.1 10*3/uL (ref 0–0.1)
Basophils Relative: 1 %
Eosinophils Absolute: 0 10*3/uL (ref 0–0.7)
Eosinophils Relative: 0 %
HEMATOCRIT: 34.5 % — AB (ref 35.0–47.0)
HEMOGLOBIN: 12 g/dL (ref 12.0–16.0)
LYMPHS PCT: 7 %
Lymphs Abs: 1 10*3/uL (ref 1.0–3.6)
MCH: 31.8 pg (ref 26.0–34.0)
MCHC: 34.7 g/dL (ref 32.0–36.0)
MCV: 91.7 fL (ref 80.0–100.0)
MONO ABS: 1.5 10*3/uL — AB (ref 0.2–0.9)
Monocytes Relative: 10 %
NEUTROS ABS: 12.5 10*3/uL — AB (ref 1.4–6.5)
NEUTROS PCT: 82 %
Platelets: 355 10*3/uL (ref 150–440)
RBC: 3.76 MIL/uL — AB (ref 3.80–5.20)
RDW: 15.8 % — ABNORMAL HIGH (ref 11.5–14.5)
WBC: 15.1 10*3/uL — AB (ref 3.6–11.0)

## 2016-12-23 LAB — COMPREHENSIVE METABOLIC PANEL
ALBUMIN: 2.6 g/dL — AB (ref 3.5–5.0)
ALT: 39 U/L (ref 14–54)
AST: 84 U/L — AB (ref 15–41)
Alkaline Phosphatase: 474 U/L — ABNORMAL HIGH (ref 38–126)
Anion gap: 12 (ref 5–15)
BILIRUBIN TOTAL: 0.8 mg/dL (ref 0.3–1.2)
BUN: 10 mg/dL (ref 6–20)
CO2: 22 mmol/L (ref 22–32)
CREATININE: 0.82 mg/dL (ref 0.44–1.00)
Calcium: 8.2 mg/dL — ABNORMAL LOW (ref 8.9–10.3)
Chloride: 104 mmol/L (ref 101–111)
GFR calc Af Amer: >60 mL/min (ref >60)
GLUCOSE: 112 mg/dL — AB (ref 65–99)
Potassium: 3.7 mmol/L (ref 3.5–5.1)
Sodium: 138 mmol/L (ref 135–145)
TOTAL PROTEIN: 6.2 g/dL — AB (ref 6.5–8.1)

## 2016-12-23 MED ORDER — SODIUM CHLORIDE 0.9 % IV SOLN
Freq: Once | INTRAVENOUS | Status: AC
  Administered 2016-12-23: 12:00:00 via INTRAVENOUS
  Filled 2016-12-23: qty 5

## 2016-12-23 MED ORDER — SODIUM CHLORIDE 0.9 % IV SOLN
50.0000 mg/m2 | Freq: Once | INTRAVENOUS | Status: AC
  Administered 2016-12-23: 90 mg via INTRAVENOUS
  Filled 2016-12-23: qty 4.5

## 2016-12-23 MED ORDER — SODIUM CHLORIDE 0.9 % IV SOLN
Freq: Once | INTRAVENOUS | Status: AC
  Administered 2016-12-23: 10:00:00 via INTRAVENOUS
  Filled 2016-12-23: qty 1000

## 2016-12-23 MED ORDER — PALONOSETRON HCL INJECTION 0.25 MG/5ML
0.2500 mg | Freq: Once | INTRAVENOUS | Status: AC
  Administered 2016-12-23: 0.25 mg via INTRAVENOUS

## 2016-12-23 MED ORDER — HEPARIN SOD (PORK) LOCK FLUSH 100 UNIT/ML IV SOLN
500.0000 [IU] | Freq: Once | INTRAVENOUS | Status: AC
  Administered 2016-12-23: 500 [IU] via INTRAVENOUS

## 2016-12-23 MED ORDER — CISPLATIN CHEMO INJECTION 100MG/100ML
80.0000 mg/m2 | Freq: Once | INTRAVENOUS | Status: AC
  Administered 2016-12-23: 138 mg via INTRAVENOUS
  Filled 2016-12-23: qty 100

## 2016-12-23 MED ORDER — ZOLEDRONIC ACID 4 MG/100ML IV SOLN
4.0000 mg | Freq: Once | INTRAVENOUS | Status: AC
  Administered 2016-12-23: 4 mg via INTRAVENOUS

## 2016-12-23 MED ORDER — SODIUM CHLORIDE 0.9% FLUSH
10.0000 mL | INTRAVENOUS | Status: DC | PRN
  Administered 2016-12-23: 10 mL via INTRAVENOUS
  Filled 2016-12-23: qty 10

## 2016-12-23 MED ORDER — POTASSIUM CHLORIDE 2 MEQ/ML IV SOLN
Freq: Once | INTRAVENOUS | Status: AC
  Administered 2016-12-23: 10:00:00 via INTRAVENOUS
  Filled 2016-12-23: qty 1000

## 2016-12-23 NOTE — Progress Notes (Signed)
Patient reports pain, nausea and appetite have all improved. Patient is currently not taking any of her prescription pain medications, reports ibuprofen as needed.

## 2016-12-24 ENCOUNTER — Inpatient Hospital Stay: Payer: 59

## 2016-12-24 VITALS — BP 152/95 | HR 108 | Temp 98.5°F | Resp 20

## 2016-12-24 DIAGNOSIS — C7A8 Other malignant neuroendocrine tumors: Secondary | ICD-10-CM

## 2016-12-24 MED ORDER — SODIUM CHLORIDE 0.9 % IV SOLN
50.0000 mg/m2 | Freq: Once | INTRAVENOUS | Status: AC
  Administered 2016-12-24: 90 mg via INTRAVENOUS
  Filled 2016-12-24: qty 4.5

## 2016-12-24 MED ORDER — DEXAMETHASONE SODIUM PHOSPHATE 10 MG/ML IJ SOLN
10.0000 mg | Freq: Once | INTRAMUSCULAR | Status: AC
  Administered 2016-12-24: 10 mg via INTRAVENOUS

## 2016-12-24 MED ORDER — SODIUM CHLORIDE 0.9 % IV SOLN
Freq: Once | INTRAVENOUS | Status: AC
  Administered 2016-12-24: 14:00:00 via INTRAVENOUS
  Filled 2016-12-24: qty 1000

## 2016-12-24 MED ORDER — HEPARIN SOD (PORK) LOCK FLUSH 100 UNIT/ML IV SOLN
500.0000 [IU] | Freq: Once | INTRAVENOUS | Status: AC
  Administered 2016-12-24: 500 [IU] via INTRAVENOUS

## 2016-12-25 ENCOUNTER — Inpatient Hospital Stay: Payer: 59

## 2016-12-25 VITALS — BP 144/97 | HR 114 | Temp 97.2°F | Resp 16

## 2016-12-25 DIAGNOSIS — C7A8 Other malignant neuroendocrine tumors: Secondary | ICD-10-CM

## 2016-12-25 MED ORDER — SODIUM CHLORIDE 0.9 % IV SOLN
50.0000 mg/m2 | Freq: Once | INTRAVENOUS | Status: AC
  Administered 2016-12-25: 90 mg via INTRAVENOUS
  Filled 2016-12-25: qty 4.5

## 2016-12-25 MED ORDER — DEXAMETHASONE SODIUM PHOSPHATE 10 MG/ML IJ SOLN
10.0000 mg | Freq: Once | INTRAMUSCULAR | Status: AC
  Administered 2016-12-25: 10 mg via INTRAVENOUS

## 2016-12-25 MED ORDER — SODIUM CHLORIDE 0.9 % IV SOLN
Freq: Once | INTRAVENOUS | Status: AC
  Administered 2016-12-25: 09:00:00 via INTRAVENOUS
  Filled 2016-12-25: qty 1000

## 2016-12-25 MED ORDER — PEGFILGRASTIM 6 MG/0.6ML ~~LOC~~ PSKT
6.0000 mg | PREFILLED_SYRINGE | Freq: Once | SUBCUTANEOUS | Status: AC
  Administered 2016-12-25: 6 mg via SUBCUTANEOUS

## 2016-12-25 MED ORDER — HEPARIN SOD (PORK) LOCK FLUSH 100 UNIT/ML IV SOLN
500.0000 [IU] | Freq: Once | INTRAVENOUS | Status: AC | PRN
  Administered 2016-12-25: 500 [IU]

## 2017-01-13 ENCOUNTER — Inpatient Hospital Stay: Payer: 59

## 2017-01-13 ENCOUNTER — Inpatient Hospital Stay (HOSPITAL_BASED_OUTPATIENT_CLINIC_OR_DEPARTMENT_OTHER): Payer: 59 | Admitting: Oncology

## 2017-01-13 ENCOUNTER — Encounter: Payer: Self-pay | Admitting: Oncology

## 2017-01-13 VITALS — BP 149/93 | HR 88 | Resp 20

## 2017-01-13 VITALS — BP 162/98 | HR 103 | Temp 98.4°F | Wt 136.9 lb

## 2017-01-13 DIAGNOSIS — C7A8 Other malignant neuroendocrine tumors: Secondary | ICD-10-CM

## 2017-01-13 DIAGNOSIS — R1011 Right upper quadrant pain: Secondary | ICD-10-CM

## 2017-01-13 DIAGNOSIS — Z79899 Other long term (current) drug therapy: Secondary | ICD-10-CM

## 2017-01-13 DIAGNOSIS — M25569 Pain in unspecified knee: Secondary | ICD-10-CM

## 2017-01-13 DIAGNOSIS — F17210 Nicotine dependence, cigarettes, uncomplicated: Secondary | ICD-10-CM | POA: Diagnosis not present

## 2017-01-13 DIAGNOSIS — Z7689 Persons encountering health services in other specified circumstances: Secondary | ICD-10-CM

## 2017-01-13 DIAGNOSIS — K861 Other chronic pancreatitis: Secondary | ICD-10-CM | POA: Diagnosis not present

## 2017-01-13 DIAGNOSIS — G4700 Insomnia, unspecified: Secondary | ICD-10-CM

## 2017-01-13 DIAGNOSIS — E785 Hyperlipidemia, unspecified: Secondary | ICD-10-CM | POA: Diagnosis not present

## 2017-01-13 DIAGNOSIS — K1379 Other lesions of oral mucosa: Secondary | ICD-10-CM | POA: Diagnosis not present

## 2017-01-13 DIAGNOSIS — R748 Abnormal levels of other serum enzymes: Secondary | ICD-10-CM | POA: Diagnosis not present

## 2017-01-13 DIAGNOSIS — I10 Essential (primary) hypertension: Secondary | ICD-10-CM

## 2017-01-13 DIAGNOSIS — I2510 Atherosclerotic heart disease of native coronary artery without angina pectoris: Secondary | ICD-10-CM | POA: Diagnosis not present

## 2017-01-13 DIAGNOSIS — I471 Supraventricular tachycardia: Secondary | ICD-10-CM

## 2017-01-13 DIAGNOSIS — C3412 Malignant neoplasm of upper lobe, left bronchus or lung: Secondary | ICD-10-CM

## 2017-01-13 DIAGNOSIS — I700 Atherosclerosis of aorta: Secondary | ICD-10-CM

## 2017-01-13 DIAGNOSIS — G893 Neoplasm related pain (acute) (chronic): Secondary | ICD-10-CM

## 2017-01-13 LAB — COMPREHENSIVE METABOLIC PANEL
ALBUMIN: 3.3 g/dL — AB (ref 3.5–5.0)
ALT: 27 U/L (ref 14–54)
ANION GAP: 9 (ref 5–15)
AST: 70 U/L — ABNORMAL HIGH (ref 15–41)
Alkaline Phosphatase: 249 U/L — ABNORMAL HIGH (ref 38–126)
BILIRUBIN TOTAL: 0.6 mg/dL (ref 0.3–1.2)
BUN: 8 mg/dL (ref 6–20)
CALCIUM: 9.1 mg/dL (ref 8.9–10.3)
CO2: 24 mmol/L (ref 22–32)
CREATININE: 0.74 mg/dL (ref 0.44–1.00)
Chloride: 103 mmol/L (ref 101–111)
GFR calc non Af Amer: >60 mL/min (ref >60)
GLUCOSE: 109 mg/dL — AB (ref 65–99)
Potassium: 3.9 mmol/L (ref 3.5–5.1)
SODIUM: 136 mmol/L (ref 135–145)
TOTAL PROTEIN: 6.7 g/dL (ref 6.5–8.1)

## 2017-01-13 LAB — CBC WITH DIFFERENTIAL/PLATELET
Basophils Absolute: 0.1 10*3/uL (ref 0–0.1)
Basophils Relative: 1 %
Eosinophils Absolute: 0 10*3/uL (ref 0–0.7)
Eosinophils Relative: 0 %
HEMATOCRIT: 32.6 % — AB (ref 35.0–47.0)
Hemoglobin: 11.4 g/dL — ABNORMAL LOW (ref 12.0–16.0)
LYMPHS ABS: 1.4 10*3/uL (ref 1.0–3.6)
LYMPHS PCT: 14 %
MCH: 33 pg (ref 26.0–34.0)
MCHC: 34.9 g/dL (ref 32.0–36.0)
MCV: 94.6 fL (ref 80.0–100.0)
MONOS PCT: 10 %
Monocytes Absolute: 1 10*3/uL — ABNORMAL HIGH (ref 0.2–0.9)
NEUTROS ABS: 7.4 10*3/uL — AB (ref 1.4–6.5)
NEUTROS PCT: 75 %
Platelets: 308 10*3/uL (ref 150–440)
RBC: 3.45 MIL/uL — AB (ref 3.80–5.20)
RDW: 19 % — ABNORMAL HIGH (ref 11.5–14.5)
WBC: 9.9 10*3/uL (ref 3.6–11.0)

## 2017-01-13 MED ORDER — POTASSIUM CHLORIDE 2 MEQ/ML IV SOLN: Freq: Once | INTRAVENOUS | Status: DC

## 2017-01-13 MED ORDER — DEXAMETHASONE SODIUM PHOSPHATE 10 MG/ML IJ SOLN: 10.0000 mg | Freq: Once | INTRAMUSCULAR | Status: DC

## 2017-01-13 MED ORDER — MANNITOL 25 % IV SOLN
Freq: Once | INTRAVENOUS | Status: AC
  Administered 2017-01-13: 10:00:00 via INTRAVENOUS
  Filled 2017-01-13: qty 1000

## 2017-01-13 MED ORDER — PALONOSETRON HCL INJECTION 0.25 MG/5ML
0.2500 mg | Freq: Once | INTRAVENOUS | Status: AC
  Administered 2017-01-13: 0.25 mg via INTRAVENOUS
  Filled 2017-01-13: qty 5

## 2017-01-13 MED ORDER — HEPARIN SOD (PORK) LOCK FLUSH 100 UNIT/ML IV SOLN
500.0000 [IU] | Freq: Once | INTRAVENOUS | Status: DC | PRN
  Filled 2017-01-13: qty 5

## 2017-01-13 MED ORDER — SODIUM CHLORIDE 0.9% FLUSH
10.0000 mL | Freq: Once | INTRAVENOUS | Status: AC
  Administered 2017-01-13: 10 mL via INTRAVENOUS
  Filled 2017-01-13: qty 10

## 2017-01-13 MED ORDER — SODIUM CHLORIDE 0.9 % IV SOLN
50.0000 mg/m2 | Freq: Once | INTRAVENOUS | Status: AC
  Administered 2017-01-13: 90 mg via INTRAVENOUS
  Filled 2017-01-13: qty 4.5

## 2017-01-13 MED ORDER — FOSAPREPITANT DIMEGLUMINE INJECTION 150 MG
Freq: Once | INTRAVENOUS | Status: AC
  Administered 2017-01-13: 12:00:00 via INTRAVENOUS
  Filled 2017-01-13: qty 5

## 2017-01-13 MED ORDER — SODIUM CHLORIDE 0.9 % IV SOLN
80.0000 mg/m2 | Freq: Once | INTRAVENOUS | Status: AC
  Administered 2017-01-13: 138 mg via INTRAVENOUS
  Filled 2017-01-13: qty 138

## 2017-01-13 MED ORDER — SODIUM CHLORIDE 0.9 % IV SOLN
Freq: Once | INTRAVENOUS | Status: AC
  Administered 2017-01-13: 10:00:00 via INTRAVENOUS
  Filled 2017-01-13: qty 1000

## 2017-01-13 MED ORDER — HEPARIN SOD (PORK) LOCK FLUSH 100 UNIT/ML IV SOLN
500.0000 [IU] | Freq: Once | INTRAVENOUS | Status: AC
  Administered 2017-01-13: 500 [IU] via INTRAVENOUS

## 2017-01-13 NOTE — Progress Notes (Signed)
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  INTERVAL HISTORY: Patient returns to clinic today for further evaluation and consideration of cycle 3, day 1 of cisplatin and etoposide. Today she complains of right upper and lower abdominal pain and fullness in her abdomen that began last Friday. She also complains of insomnia. She has not fallen recently. She is worried about her blood pressure and has noticed her blood pressure has been elevated recently  Her appetite has improved and she is gaining weight. She is not using her fentanyl patch and states she feels like this could have been causing her nausea.  She has no additional neurologic complaints. She denies any recent fevers or illnesses. She denies any chest pain, shortness of breath, cough, or hemoptysis. She has no nausea, vomiting, constipation, or diarrhea. She has no urinary complaints. Patient otherwise feels well and offers no further specific complaints.  REVIEW OF SYSTEMS:   Review of Systems  Constitutional: Positive for malaise/fatigue. Negative for fever and weight loss.  Respiratory: Negative.  Negative for cough, hemoptysis and shortness of breath.   Cardiovascular: Negative.  Negative for chest pain and leg swelling.  Gastrointestinal: Positive for abdominal pain. Negative for blood in stool, melena, nausea and vomiting.  Genitourinary: Negative.  Negative for flank pain.  Musculoskeletal: Positive for joint pain.  Skin: Negative.  Negative for rash.  Neurological: Positive for weakness. Negative for sensory change.  Psychiatric/Behavioral: Negative for memory loss. The patient has insomnia. The patient is not nervous/anxious.       As per HPI. Otherwise, a complete review of systems is negative.  PAST MEDICAL HISTORY: Past Medical History:  Diagnosis Date  . Coronary artery disease    Cardiac catheterization in January of 2015 showed mild nonobstructive three-vessel coronary artery disease with significant mid LAD spasm which improved with nitroglycerin. Metoprolol was switched to diltiazem  . Hyperlipidemia   . Hypertension   . PSVT (paroxysmal supraventricular tachycardia) ([place])     PAST SURGICAL HISTORY: Past Surgical History:  Procedure Laterality Date  . CARDIAC CATHETERIZATION  06/2013   [facility]  . PORTA CATH INSERTION N/A 11/27/2016   Procedure: Glori Luis Cath Insertion;  Surgeon: Katha Cabal, MD;  Location: [place] [facility];  Service: Cardiovascular;  Laterality: N/A;  . SUPRAVENTRICULAR TACHYCARDIA ABLATION N/A 04/22/2014   Procedure: SUPRAVENTRICULAR TACHYCARDIA ABLATION;  Surgeon: Evans Lance, MD;  Location: [facility] CATH LAB;  Service: Cardiovascular;  Laterality: N/A;  . TONSILLECTOMY    . TUBAL LIGATION      FAMILY HISTORY: Family History  Problem Relation Age of Onset  . Stroke Mother   . Hypertension Mother   . Heart attack Father   . Thyroid cancer Sister   . Stroke Sister     ADVANCED DIRECTIVES (Y/N):  N  HEALTH MAINTENANCE: Social History  Substance Use Topics  . Smoking status: Current Some Day Smoker    Years: 30.00    Types: Cigarettes  . Smokeless tobacco: Never Used  . Alcohol use No     Colonoscopy:  PAP:  Bone density:  Lipid panel:  Allergies  Allergen Reactions  . Bee Venom Swelling  . Codeine Nausea And Vomiting    Pruritic rash  . Sulfa Antibiotics Other (See Comments)  Other reaction(s): Pruritic rash  . Penicillin G Rash    Has patient had a PCN reaction causing immediate rash, facial/tongue/throat swelling, SOB or lightheadedness with hypotension: Yes Has patient had a PCN reaction causing severe rash involving mucus membranes or skin necrosis:  No Has patient had a PCN reaction that required hospitalization No Has patient had a PCN reaction occurring within the last 10 years: No If all of the above answers are "NO", then may proceed with Cephalosporin use.   . Shellfish Allergy Rash    Current Outpatient Prescriptions  Medication Sig Dispense Refill  . albuterol (PROVENTIL HFA;VENTOLIN HFA) 108 (90 BASE) MCG/ACT inhaler Inhale 2 puffs into the lungs every 4 (four) hours as needed for wheezing or shortness of breath.    Marland Kitchen ibuprofen (ADVIL,MOTRIN) 200 MG tablet Take 200 mg by mouth every 6 (six) hours as needed.    . lidocaine-prilocaine (EMLA) cream Apply to affected area once 30 g 3  . lisinopril (PRINIVIL,ZESTRIL) 10 MG tablet Take 10 mg by mouth daily.    . magic mouthwash w/lidocaine SOLN Take 5 mLs by mouth 3 (three) times daily as needed. May swish and swallow. 300 mL 2  . omeprazole (PRILOSEC) 20 MG capsule Take 40 mg by mouth daily.    . ondansetron (ZOFRAN) 8 MG tablet Take 1 tablet (8 mg total) by mouth 2 (two) times daily as needed. Start on the third day after cisplatin chemotherapy. 60 tablet 2  . prochlorperazine (COMPAZINE) 10 MG tablet Take 1 tablet (10 mg total) by mouth every 6 (six) hours as needed (Nausea or vomiting). 60 tablet 2  . sucralfate (CARAFATE) 1 g tablet Take 1 tablet (1 g total) by mouth 2 (two) times daily. Dissolve tablet in water then drink. 60 tablet 2  . traMADol (ULTRAM) 50 MG tablet Take 1 tablet (50 mg total) by mouth 3 (three) times daily as needed. 60 tablet 0   No current facility-administered medications for this visit.    Facility-Administered Medications Ordered in Other Visits  Medication Dose Route Frequency Provider Last Rate Last Dose  . CISplatin (PLATINOL) 138 mg in sodium chloride 0.9 % 500 mL chemo infusion  80 mg/m2 (Treatment Plan Recorded) Intravenous Once Lloyd Huger, MD      . etoposide (VEPESID) 90 mg in sodium chloride 0.9 % 250 mL chemo infusion  50 mg/m2  (Treatment Plan Recorded) Intravenous Once Lloyd Huger, MD      . fosaprepitant (EMEND) 150 mg, dexamethasone (DECADRON) 12 mg in sodium chloride 0.9 % 145 mL IVPB   Intravenous Once Lloyd Huger, MD      . heparin lock flush 100 unit/mL  500 Units Intravenous Once Lloyd Huger, MD      . heparin lock flush 100 unit/mL  500 Units Intracatheter Once PRN Lloyd Huger, MD      . palonosetron (ALOXI) injection 0.25 mg  0.25 mg Intravenous Once Lloyd Huger, MD        OBJECTIVE: Vitals:   01/13/17 0848  BP: (!) 162/98  Pulse: (!) 103  Temp: 98.4 F (36.9 C)     Body mass index is 22.78 kg/m.    ECOG FS:1 - Symptomatic but completely ambulatory  General: Well-developed, well-nourished, no acute distress. Eyes: Pink conjunctiva, anicteric sclera. Lungs: Clear to auscultation bilaterally. Heart: Regular rate and rhythm. No rubs, murmurs, or gallops. Abdomen: Right lower/upper abdomen tenderness. Enlarged liver palpated. Hypoactive bowel sounds. Musculoskeletal: No edema, cyanosis, or clubbing. Neuro: Alert,  answering all questions appropriately. Cranial nerves grossly intact. Skin: No rashes or petechiae noted. Psych: Normal affect.   LAB RESULTS:  Lab Results  Component Value Date   NA 136 01/13/2017   K 3.9 01/13/2017   CL 103 01/13/2017   CO2 24 01/13/2017   GLUCOSE 109 (H) 01/13/2017   BUN 8 01/13/2017   CREATININE 0.74 01/13/2017   CALCIUM 9.1 01/13/2017   PROT 6.7 01/13/2017   ALBUMIN 3.3 (L) 01/13/2017   AST 70 (H) 01/13/2017   ALT 27 01/13/2017   ALKPHOS 249 (H) 01/13/2017   BILITOT 0.6 01/13/2017   GFRNONAA >60 01/13/2017   GFRAA >60 01/13/2017    Lab Results  Component Value Date   WBC 9.9 01/13/2017   NEUTROABS 7.4 (H) 01/13/2017   HGB 11.4 (L) 01/13/2017   HCT 32.6 (L) 01/13/2017   MCV 94.6 01/13/2017   PLT 308 01/13/2017     STUDIES: No results found.  ASSESSMENT: Stage IV large cell neuroendocrine  carcinoma.  PLAN:    1. Stage IV large cell neuroendocrine carcinoma: PET scan results reviewed independently with widespread lesions consistent with metastatic disease. Patient's AFP is normal, but the remainder of her tumor markers are significantly elevated. MRI of the brain is negative for metastatic disease. After lengthy discussion with the patient, she wishes to pursue aggressive treatment with chemotherapy using cisplatin and etoposide. Patient will also receive Neulasta support. Continue with dose reduced etoposide for cycle 3. Plan on 4 cycles every 3 weeks and then reimage. Proceed with cycle 3, day 1 of cisplatin and etoposide today. Return to clinic in 1 and 2 days for etoposide only and then in 3 weeks for consideration of cycle 4. 2. Elevated liver enzymes/bilirubin: Likely secondary to progressive disease. Bilirubin normal. AST 84. Continue to monitor.  3. Hypocalcemia: Resolved. 4. Right sided abdominal pain: Abdominal U/S Wednesday January 15, 2017. Monitor.  5. Disposition: Patient's poor prognosis as well as consideration of hospice and end-of-life care were previously discussed previously, but patient wishes to continue with aggressive treatment at this time. 6. Mouth pain: Resolved. 7. Knee pain: Resolved.  8. Generalized Pain: Patient has stopped her fentanyl patch. States she thinks it was causing nausea. She takes Ibuprofen PRN for pain.  9. Hypertension: 162/98. Restart Lisinopril 5 mg. Check blood pressure daily. If blood pressure systolically is less then 100 then hold lisinopril. Lisinopril was initially stopped due to hypotension. Goal blood pressure is less then 140/90. 10. Insomnia: Gave patient many over-the-counter medications to try. She has Unisom at home and will start with that.  Patient expressed understanding and was in agreement with this plan. She also understands that She can call clinic at any time with any questions, concerns, or complaints.   Cancer  Staging No matching staging information was found for the patient.  Jacquelin Hawking, NP   01/13/2017 10:57 AM

## 2017-01-14 ENCOUNTER — Inpatient Hospital Stay: Payer: 59

## 2017-01-14 VITALS — BP 173/114 | HR 105 | Temp 98.3°F | Resp 18

## 2017-01-14 DIAGNOSIS — C7A8 Other malignant neuroendocrine tumors: Secondary | ICD-10-CM

## 2017-01-14 MED ORDER — ETOPOSIDE CHEMO INJECTION 1 GM/50ML
50.0000 mg/m2 | Freq: Once | INTRAVENOUS | Status: DC
  Filled 2017-01-14: qty 4.5

## 2017-01-14 MED ORDER — HEPARIN SOD (PORK) LOCK FLUSH 100 UNIT/ML IV SOLN
500.0000 [IU] | Freq: Once | INTRAVENOUS | Status: AC
  Administered 2017-01-14: 500 [IU] via INTRAVENOUS

## 2017-01-14 MED ORDER — DEXAMETHASONE SODIUM PHOSPHATE 10 MG/ML IJ SOLN: 10.0000 mg | Freq: Once | INTRAMUSCULAR | Status: DC

## 2017-01-14 MED ORDER — SODIUM CHLORIDE 0.9 % IV SOLN
Freq: Once | INTRAVENOUS | Status: DC
  Filled 2017-01-14: qty 1000

## 2017-01-14 NOTE — Progress Notes (Signed)
Pt here today for Day 2 Etoposide. Upon arrival pt's BP elevated. Spoke with on Rulon Abide, NP about BP. Treatment held for today.Pt to return to clinic tomorrow 8/1 for etoposide

## 2017-01-15 ENCOUNTER — Ambulatory Visit
Admission: RE | Admit: 2017-01-15 | Discharge: 2017-01-15 | Disposition: A | Discharge status: Home / Self Care | Payer: 59 | Source: Ambulatory Visit | Attending: Oncology | Admitting: Oncology

## 2017-01-15 ENCOUNTER — Inpatient Hospital Stay: Payer: 59 | Attending: Oncology

## 2017-01-15 ENCOUNTER — Telehealth: Payer: Self-pay | Admitting: *Deleted

## 2017-01-15 VITALS — BP 126/85 | HR 116 | Temp 98.9°F | Resp 18

## 2017-01-15 DIAGNOSIS — Z79899 Other long term (current) drug therapy: Secondary | ICD-10-CM | POA: Diagnosis not present

## 2017-01-15 DIAGNOSIS — F17210 Nicotine dependence, cigarettes, uncomplicated: Secondary | ICD-10-CM | POA: Diagnosis not present

## 2017-01-15 DIAGNOSIS — I471 Supraventricular tachycardia: Secondary | ICD-10-CM | POA: Diagnosis not present

## 2017-01-15 DIAGNOSIS — I2510 Atherosclerotic heart disease of native coronary artery without angina pectoris: Secondary | ICD-10-CM | POA: Diagnosis not present

## 2017-01-15 DIAGNOSIS — G4730 Sleep apnea, unspecified: Secondary | ICD-10-CM | POA: Diagnosis not present

## 2017-01-15 DIAGNOSIS — Z808 Family history of malignant neoplasm of other organs or systems: Secondary | ICD-10-CM | POA: Diagnosis not present

## 2017-01-15 DIAGNOSIS — C7A8 Other malignant neuroendocrine tumors: Secondary | ICD-10-CM | POA: Insufficient documentation

## 2017-01-15 DIAGNOSIS — M25562 Pain in left knee: Secondary | ICD-10-CM | POA: Insufficient documentation

## 2017-01-15 DIAGNOSIS — Z7689 Persons encountering health services in other specified circumstances: Secondary | ICD-10-CM | POA: Insufficient documentation

## 2017-01-15 DIAGNOSIS — Z5111 Encounter for antineoplastic chemotherapy: Secondary | ICD-10-CM | POA: Insufficient documentation

## 2017-01-15 DIAGNOSIS — C3412 Malignant neoplasm of upper lobe, left bronchus or lung: Secondary | ICD-10-CM | POA: Diagnosis present

## 2017-01-15 DIAGNOSIS — I10 Essential (primary) hypertension: Secondary | ICD-10-CM | POA: Insufficient documentation

## 2017-01-15 DIAGNOSIS — K7689 Other specified diseases of liver: Secondary | ICD-10-CM | POA: Insufficient documentation

## 2017-01-15 DIAGNOSIS — R948 Abnormal results of function studies of other organs and systems: Secondary | ICD-10-CM | POA: Diagnosis not present

## 2017-01-15 MED ORDER — SODIUM CHLORIDE 0.9 % IV SOLN
Freq: Once | INTRAVENOUS | Status: AC
  Administered 2017-01-15: 11:00:00 via INTRAVENOUS
  Filled 2017-01-15: qty 1000

## 2017-01-15 MED ORDER — HEPARIN SOD (PORK) LOCK FLUSH 100 UNIT/ML IV SOLN
500.0000 [IU] | Freq: Once | INTRAVENOUS | Status: AC
  Administered 2017-01-15: 500 [IU] via INTRAVENOUS
  Filled 2017-01-15: qty 5

## 2017-01-15 MED ORDER — SODIUM CHLORIDE 0.9% FLUSH
10.0000 mL | INTRAVENOUS | Status: DC | PRN
  Administered 2017-01-15: 10 mL via INTRAVENOUS
  Filled 2017-01-15: qty 10

## 2017-01-15 MED ORDER — DEXAMETHASONE SODIUM PHOSPHATE 10 MG/ML IJ SOLN
10.0000 mg | Freq: Once | INTRAMUSCULAR | Status: AC
  Administered 2017-01-15: 10 mg via INTRAVENOUS
  Filled 2017-01-15: qty 1

## 2017-01-15 MED ORDER — SODIUM CHLORIDE 0.9 % IV SOLN
50.0000 mg/m2 | Freq: Once | INTRAVENOUS | Status: AC
  Administered 2017-01-15: 90 mg via INTRAVENOUS
  Filled 2017-01-15: qty 4.5

## 2017-01-15 NOTE — Telephone Encounter (Signed)
Phone call made to patient without answer to give results from ultrasound. Unable to leave message. Will try again later.

## 2017-01-16 ENCOUNTER — Inpatient Hospital Stay: Payer: 59

## 2017-01-16 VITALS — BP 144/92 | HR 64 | Temp 98.7°F | Resp 18

## 2017-01-16 DIAGNOSIS — C7A8 Other malignant neuroendocrine tumors: Secondary | ICD-10-CM | POA: Diagnosis not present

## 2017-01-16 MED ORDER — SODIUM CHLORIDE 0.9% FLUSH
10.0000 mL | INTRAVENOUS | Status: DC | PRN
  Administered 2017-01-16: 10 mL
  Filled 2017-01-16: qty 10

## 2017-01-16 MED ORDER — PEGFILGRASTIM 6 MG/0.6ML ~~LOC~~ PSKT
6.0000 mg | PREFILLED_SYRINGE | Freq: Once | SUBCUTANEOUS | Status: AC
  Administered 2017-01-16: 6 mg via SUBCUTANEOUS
  Filled 2017-01-16: qty 0.6

## 2017-01-16 MED ORDER — DEXAMETHASONE SODIUM PHOSPHATE 10 MG/ML IJ SOLN
10.0000 mg | Freq: Once | INTRAMUSCULAR | Status: AC
  Administered 2017-01-16: 10 mg via INTRAVENOUS
  Filled 2017-01-16: qty 1

## 2017-01-16 MED ORDER — SODIUM CHLORIDE 0.9 % IV SOLN
50.0000 mg/m2 | Freq: Once | INTRAVENOUS | Status: AC
  Administered 2017-01-16: 90 mg via INTRAVENOUS
  Filled 2017-01-16: qty 4.5

## 2017-01-16 MED ORDER — SODIUM CHLORIDE 0.9 % IV SOLN
Freq: Once | INTRAVENOUS | Status: AC
  Administered 2017-01-16: 10:00:00 via INTRAVENOUS
  Filled 2017-01-16: qty 1000

## 2017-01-16 MED ORDER — HEPARIN SOD (PORK) LOCK FLUSH 100 UNIT/ML IV SOLN
500.0000 [IU] | Freq: Once | INTRAVENOUS | Status: AC | PRN
  Administered 2017-01-16: 500 [IU]
  Filled 2017-01-16: qty 5

## 2017-01-16 NOTE — Telephone Encounter (Signed)
Pt given results of ultrasound while in clinic today.

## 2017-01-22 ENCOUNTER — Ambulatory Visit: Payer: 59

## 2017-01-30 ENCOUNTER — Ambulatory Visit
Admission: RE | Admit: 2017-01-30 | Discharge: 2017-01-30 | Disposition: A | Discharge status: Home / Self Care | Payer: 59 | Source: Ambulatory Visit | Attending: Radiation Oncology | Admitting: Radiation Oncology

## 2017-01-30 ENCOUNTER — Encounter: Payer: Self-pay | Admitting: Radiation Oncology

## 2017-01-30 VITALS — BP 167/116 | HR 107 | Temp 98.5°F | Resp 20 | Wt 141.9 lb

## 2017-01-30 DIAGNOSIS — M25562 Pain in left knee: Secondary | ICD-10-CM | POA: Diagnosis not present

## 2017-01-30 DIAGNOSIS — Z923 Personal history of irradiation: Secondary | ICD-10-CM | POA: Diagnosis not present

## 2017-01-30 DIAGNOSIS — C7A1 Malignant poorly differentiated neuroendocrine tumors: Secondary | ICD-10-CM | POA: Diagnosis not present

## 2017-01-30 DIAGNOSIS — Z9221 Personal history of antineoplastic chemotherapy: Secondary | ICD-10-CM | POA: Diagnosis not present

## 2017-01-30 DIAGNOSIS — C7951 Secondary malignant neoplasm of bone: Secondary | ICD-10-CM | POA: Insufficient documentation

## 2017-01-30 NOTE — Progress Notes (Signed)
Radiation Oncology Follow up Note  Name: Latasha [name]   Date:   01/30/2017 MRN:  [id number] DOB: 03-Feb-1956    This 61 y.o. female presents to the clinic today for one-month follow-up status post palliative radiation therapy to her thoracic spine.  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 She is currently undergoing cis-platinum and etoposide chemotherapy with Neulasta support. She does have some pain in her left knee it looks swollen some vague history of fall.  COMPLICATIONS OF TREATMENT: none  FOLLOW UP COMPLIANCE: keeps appointments   PHYSICAL EXAM:  BP (!) 167/116   Pulse (!) 107   Temp 98.5 F (36.9 C)   Resp 20   Wt 141 lb 13.9 oz (64.4 kg)   BMI 23.61 kg/m  Deep palpation of her spine does not elicit pain. Motor sensory and DTR levels are equal and symmetric in upper lower extremities. There is some swelling of the left knee does not appear to be fractured. Well-developed well-nourished patient in NAD. HEENT reveals PERLA, EOMI, discs not visualized.  Oral cavity is clear. No oral mucosal lesions are identified. Neck is clear without evidence of cervical or supraclavicular adenopathy. Lungs are clear to A&P. Cardiac examination is essentially unremarkable with regular rate and rhythm without murmur rub or thrill. Abdomen is benign with no organomegaly or masses noted. Motor sensory and DTR levels are equal and symmetric in the upper and lower extremities. Cranial nerves II through XII are grossly intact. Proprioception is intact. No peripheral adenopathy or edema is identified. No motor or sensory levels are noted. Crude visual fields are within normal range.  RADIOLOGY RESULTS: No current films for review  PLAN: At the present time patient has achieved excellent palliation  from her thoracic spine radiation. I will turn follow-up care over to medical oncology. I would be happy to reevaluate patient at any time should further palliative treatment be indicated.  I would like to take this opportunity to thank you for allowing me to participate in the care of your patient.Armstead Peaks., MD

## 2017-02-01 ENCOUNTER — Emergency Department: Payer: 59

## 2017-02-01 ENCOUNTER — Observation Stay
Admission: EM | Admit: 2017-02-01 | Discharge: 2017-02-02 | Disposition: A | Discharge status: Home / Self Care | Payer: 59 | Attending: Internal Medicine | Admitting: Internal Medicine

## 2017-02-01 DIAGNOSIS — Z9221 Personal history of antineoplastic chemotherapy: Secondary | ICD-10-CM | POA: Insufficient documentation

## 2017-02-01 DIAGNOSIS — Z8249 Family history of ischemic heart disease and other diseases of the circulatory system: Secondary | ICD-10-CM | POA: Insufficient documentation

## 2017-02-01 DIAGNOSIS — Z9889 Other specified postprocedural states: Secondary | ICD-10-CM | POA: Insufficient documentation

## 2017-02-01 DIAGNOSIS — R7989 Other specified abnormal findings of blood chemistry: Secondary | ICD-10-CM | POA: Insufficient documentation

## 2017-02-01 DIAGNOSIS — I248 Other forms of acute ischemic heart disease: Principal | ICD-10-CM | POA: Insufficient documentation

## 2017-02-01 DIAGNOSIS — R740 Nonspecific elevation of levels of transaminase and lactic acid dehydrogenase [LDH]: Secondary | ICD-10-CM | POA: Diagnosis not present

## 2017-02-01 DIAGNOSIS — E785 Hyperlipidemia, unspecified: Secondary | ICD-10-CM | POA: Diagnosis not present

## 2017-02-01 DIAGNOSIS — D649 Anemia, unspecified: Secondary | ICD-10-CM | POA: Diagnosis not present

## 2017-02-01 DIAGNOSIS — I471 Supraventricular tachycardia: Secondary | ICD-10-CM | POA: Insufficient documentation

## 2017-02-01 DIAGNOSIS — R7401 Elevation of levels of liver transaminase levels: Secondary | ICD-10-CM

## 2017-02-01 DIAGNOSIS — Z923 Personal history of irradiation: Secondary | ICD-10-CM | POA: Diagnosis not present

## 2017-02-01 DIAGNOSIS — Z885 Allergy status to narcotic agent status: Secondary | ICD-10-CM | POA: Insufficient documentation

## 2017-02-01 DIAGNOSIS — C7A8 Other malignant neuroendocrine tumors: Secondary | ICD-10-CM | POA: Diagnosis not present

## 2017-02-01 DIAGNOSIS — Z880 Allergy status to penicillin: Secondary | ICD-10-CM | POA: Insufficient documentation

## 2017-02-01 DIAGNOSIS — Z79899 Other long term (current) drug therapy: Secondary | ICD-10-CM | POA: Insufficient documentation

## 2017-02-01 DIAGNOSIS — I160 Hypertensive urgency: Secondary | ICD-10-CM | POA: Diagnosis not present

## 2017-02-01 DIAGNOSIS — Z91030 Bee allergy status: Secondary | ICD-10-CM | POA: Diagnosis not present

## 2017-02-01 DIAGNOSIS — I2510 Atherosclerotic heart disease of native coronary artery without angina pectoris: Secondary | ICD-10-CM | POA: Insufficient documentation

## 2017-02-01 DIAGNOSIS — R112 Nausea with vomiting, unspecified: Secondary | ICD-10-CM | POA: Diagnosis not present

## 2017-02-01 DIAGNOSIS — R000 Tachycardia, unspecified: Secondary | ICD-10-CM

## 2017-02-01 DIAGNOSIS — I10 Essential (primary) hypertension: Secondary | ICD-10-CM

## 2017-02-01 DIAGNOSIS — Z91013 Allergy to seafood: Secondary | ICD-10-CM | POA: Diagnosis not present

## 2017-02-01 DIAGNOSIS — Z882 Allergy status to sulfonamides status: Secondary | ICD-10-CM | POA: Diagnosis not present

## 2017-02-01 DIAGNOSIS — C7B8 Other secondary neuroendocrine tumors: Secondary | ICD-10-CM | POA: Insufficient documentation

## 2017-02-01 DIAGNOSIS — F17210 Nicotine dependence, cigarettes, uncomplicated: Secondary | ICD-10-CM | POA: Diagnosis not present

## 2017-02-01 DIAGNOSIS — I2489 Other forms of acute ischemic heart disease: Secondary | ICD-10-CM | POA: Diagnosis present

## 2017-02-01 DIAGNOSIS — I674 Hypertensive encephalopathy: Secondary | ICD-10-CM

## 2017-02-01 HISTORY — DX: Malignant (primary) neoplasm, unspecified: C80.1

## 2017-02-01 LAB — COMPREHENSIVE METABOLIC PANEL
ALBUMIN: 3.5 g/dL (ref 3.5–5.0)
ALT: 15 U/L (ref 14–54)
AST: 41 U/L (ref 15–41)
Alkaline Phosphatase: 203 U/L — ABNORMAL HIGH (ref 38–126)
Anion gap: 9 (ref 5–15)
BUN: 6 mg/dL (ref 6–20)
CHLORIDE: 101 mmol/L (ref 101–111)
CO2: 25 mmol/L (ref 22–32)
Calcium: 9.5 mg/dL (ref 8.9–10.3)
Creatinine, Ser: 0.83 mg/dL (ref 0.44–1.00)
GFR calc Af Amer: >60 mL/min (ref >60)
GFR calc non Af Amer: >60 mL/min (ref >60)
GLUCOSE: 104 mg/dL — AB (ref 65–99)
POTASSIUM: 4.2 mmol/L (ref 3.5–5.1)
SODIUM: 135 mmol/L (ref 135–145)
Total Bilirubin: 0.6 mg/dL (ref 0.3–1.2)
Total Protein: 7 g/dL (ref 6.5–8.1)

## 2017-02-01 LAB — CBC WITH DIFFERENTIAL/PLATELET
Basophils Absolute: 0.1 10*3/uL (ref 0–0.1)
Basophils Relative: 1 %
EOS PCT: 0 %
Eosinophils Absolute: 0 10*3/uL (ref 0–0.7)
HCT: 33.9 % — ABNORMAL LOW (ref 35.0–47.0)
Hemoglobin: 11.8 g/dL — ABNORMAL LOW (ref 12.0–16.0)
LYMPHS ABS: 1.8 10*3/uL (ref 1.0–3.6)
LYMPHS PCT: 18 %
MCH: 33.4 pg (ref 26.0–34.0)
MCHC: 34.8 g/dL (ref 32.0–36.0)
MCV: 95.9 fL (ref 80.0–100.0)
MONO ABS: 0.6 10*3/uL (ref 0.2–0.9)
MONOS PCT: 6 %
Neutro Abs: 7.4 10*3/uL — ABNORMAL HIGH (ref 1.4–6.5)
Neutrophils Relative %: 75 %
PLATELETS: 317 10*3/uL (ref 150–440)
RBC: 3.53 MIL/uL — AB (ref 3.80–5.20)
RDW: 20.1 % — AB (ref 11.5–14.5)
WBC: 9.9 10*3/uL (ref 3.6–11.0)

## 2017-02-01 LAB — URINALYSIS, COMPLETE (UACMP) WITH MICROSCOPIC
BACTERIA UA: NONE SEEN
BILIRUBIN URINE: NEGATIVE
GLUCOSE, UA: NEGATIVE mg/dL
HGB URINE DIPSTICK: NEGATIVE
KETONES UR: NEGATIVE mg/dL
LEUKOCYTES UA: NEGATIVE
NITRITE: NEGATIVE
PH: 7 (ref 5.0–8.0)
Protein, ur: NEGATIVE mg/dL
RBC / HPF: NONE SEEN RBC/hpf (ref 0–5)
SPECIFIC GRAVITY, URINE: 1.006 (ref 1.005–1.030)
Squamous Epithelial / LPF: NONE SEEN

## 2017-02-01 LAB — TROPONIN I
TROPONIN I: 0.09 ng/mL — AB (ref <0.03)
Troponin I: <0.03 ng/mL (ref <0.03)
Troponin I: <0.03 ng/mL (ref <0.03)

## 2017-02-01 LAB — LIPASE, BLOOD: Lipase: 23 U/L (ref 11–51)

## 2017-02-01 MED ORDER — ALUM & MAG HYDROXIDE-SIMETH 200-200-20 MG/5ML PO SUSP
15.0000 mL | ORAL | Status: DC | PRN
  Administered 2017-02-01: 15 mL via ORAL
  Filled 2017-02-01: qty 30

## 2017-02-01 MED ORDER — FENTANYL CITRATE (PF) 100 MCG/2ML IJ SOLN
50.0000 ug | Freq: Once | INTRAMUSCULAR | Status: AC
  Administered 2017-02-01: 50 ug via INTRAVENOUS
  Filled 2017-02-01: qty 2

## 2017-02-01 MED ORDER — LISINOPRIL 10 MG PO TABS
10.0000 mg | ORAL_TABLET | Freq: Every day | ORAL | Status: DC
  Administered 2017-02-01 – 2017-02-02 (×2): 10 mg via ORAL
  Filled 2017-02-01 (×2): qty 1

## 2017-02-01 MED ORDER — LABETALOL HCL 5 MG/ML IV SOLN
10.0000 mg | INTRAVENOUS | Status: DC | PRN
  Administered 2017-02-01: 10 mg via INTRAVENOUS
  Filled 2017-02-01: qty 4

## 2017-02-01 MED ORDER — TRAMADOL HCL 50 MG PO TABS: 50.0000 mg | ORAL_TABLET | Freq: Four times a day (QID) | ORAL | Status: DC | PRN

## 2017-02-01 MED ORDER — PANTOPRAZOLE SODIUM 40 MG PO TBEC
40.0000 mg | DELAYED_RELEASE_TABLET | Freq: Every day | ORAL | Status: DC
  Administered 2017-02-01 – 2017-02-02 (×2): 40 mg via ORAL
  Filled 2017-02-01 (×2): qty 1

## 2017-02-01 MED ORDER — ASPIRIN EC 81 MG PO TBEC
81.0000 mg | DELAYED_RELEASE_TABLET | Freq: Every day | ORAL | Status: DC
  Administered 2017-02-01 – 2017-02-02 (×2): 81 mg via ORAL
  Filled 2017-02-01 (×2): qty 1

## 2017-02-01 MED ORDER — ENOXAPARIN SODIUM 40 MG/0.4ML ~~LOC~~ SOLN
40.0000 mg | SUBCUTANEOUS | Status: DC
  Administered 2017-02-01: 40 mg via SUBCUTANEOUS
  Filled 2017-02-01: qty 0.4

## 2017-02-01 MED ORDER — ACETAMINOPHEN 650 MG RE SUPP: 650.0000 mg | Freq: Four times a day (QID) | RECTAL | Status: DC | PRN

## 2017-02-01 MED ORDER — ACETAMINOPHEN 325 MG PO TABS
650.0000 mg | ORAL_TABLET | Freq: Once | ORAL | Status: AC
  Administered 2017-02-01: 650 mg via ORAL
  Filled 2017-02-01: qty 2

## 2017-02-01 MED ORDER — ONDANSETRON HCL 4 MG PO TABS: 4.0000 mg | ORAL_TABLET | Freq: Four times a day (QID) | ORAL | Status: DC | PRN

## 2017-02-01 MED ORDER — LABETALOL HCL 5 MG/ML IV SOLN
10.0000 mg | Freq: Once | INTRAVENOUS | Status: AC
Start: 2017-02-01 — End: 2017-02-01
  Administered 2017-02-01: 10 mg via INTRAVENOUS
  Filled 2017-02-01: qty 4

## 2017-02-01 MED ORDER — METOPROLOL TARTRATE 25 MG PO TABS
25.0000 mg | ORAL_TABLET | Freq: Two times a day (BID) | ORAL | Status: DC
  Administered 2017-02-01: 25 mg via ORAL
  Filled 2017-02-01 (×2): qty 1

## 2017-02-01 MED ORDER — ONDANSETRON HCL 4 MG/2ML IJ SOLN
4.0000 mg | Freq: Once | INTRAMUSCULAR | Status: AC
  Administered 2017-02-01: 4 mg via INTRAVENOUS
  Filled 2017-02-01: qty 2

## 2017-02-01 MED ORDER — ACETAMINOPHEN 325 MG PO TABS
650.0000 mg | ORAL_TABLET | Freq: Four times a day (QID) | ORAL | Status: DC | PRN
  Administered 2017-02-01: 650 mg via ORAL
  Filled 2017-02-01: qty 2

## 2017-02-01 MED ORDER — ONDANSETRON HCL 4 MG/2ML IJ SOLN: 4.0000 mg | Freq: Four times a day (QID) | INTRAMUSCULAR | Status: DC | PRN

## 2017-02-01 MED ORDER — SODIUM CHLORIDE 0.9 % IV BOLUS (SEPSIS)
500.0000 mL | Freq: Once | INTRAVENOUS | Status: AC
  Administered 2017-02-01: 500 mL via INTRAVENOUS

## 2017-02-01 MED ORDER — SODIUM CHLORIDE 0.9 % IV SOLN
INTRAVENOUS | Status: DC
  Administered 2017-02-01 – 2017-02-02 (×2): via INTRAVENOUS

## 2017-02-01 MED ORDER — PROCHLORPERAZINE MALEATE 10 MG PO TABS
10.0000 mg | ORAL_TABLET | Freq: Four times a day (QID) | ORAL | Status: DC | PRN
  Filled 2017-02-01: qty 1

## 2017-02-01 NOTE — ED Provider Notes (Addendum)
[facility] Emergency Department Provider Note  ____________________________________________   I have reviewed the triage vital signs and the nursing notes.   HISTORY  Chief Complaint Hypertension    HPI Latasha [name] is a 61 y.o. female who presents today complaining of elevated blood pressure and vomiting. Patient has had no chest pain or shortness of breath, she is on chemotherapy but they had to hold her chemotherapy last time because her blood pressure was too high she states. Patient has large  neuroendocrine cancer with multiple metastases. She is on palliative chemotherapy. Patient had a recent MRI which showed calvarium but no other brain metastases. Patient has presented here today with this issue. Patient was on lisinopril for many years but they took her off because her blood pressures were running low and initially started chemotherapy. However 3 weeks ago they restarted her lisinopril. Therefore she does not know her baseline blood pressure is. She has had severe nausea and occasional vomiting since inception of chemotherapy. Her vomiting began this morning. She also had a gradual onset headache not worst of life, consistent with prior "hypertension headaches". She states she tried twice to take her lisinopril this morning but threw it up both times however there was some in between the time she took the pill and the time she vomited so she is not sure how much of the pill she retained. She denies a focal numbness or weakness, she has no chest pain, she denies any diarrhea, she denies any abdominal pain, she is having normal bowel movements most recent insulin this morning. She denies any fever or chills. She denies any focal neurologic deficit. She would like me to bring down her blood pressure, she is very anxious. Headache is generalized gradual onset not worst of life    Past Medical History:  Diagnosis Date  . Cancer ([place])   . Coronary artery  disease    Cardiac catheterization in January of 2015 showed mild nonobstructive three-vessel coronary artery disease with significant mid LAD spasm which improved with nitroglycerin. Metoprolol was switched to diltiazem  . Hyperlipidemia   . Hypertension   . PSVT (paroxysmal supraventricular tachycardia) [facility])     Patient Active Problem List   Diagnosis Date Noted  . Large cell neuroendocrine carcinoma ([place]) 11/16/2016  . Malignant neoplasm of right upper lobe of lung ([place]) 11/07/2016  . Malignant neoplasm of upper lobe of left lung ([place]) 11/07/2016  . Rib pain on right side 11/07/2016  . Mass of left lung 11/05/2016  . Mass of multiple sites of liver 11/05/2016  . Chronic non-seasonal allergic rhinitis 04/11/2016  . SVT (supraventricular tachycardia) ([place]) 04/22/2014  . PSVT (paroxysmal supraventricular tachycardia) ([place])   . Coronary atherosclerosis   . Hyperlipidemia, unspecified   . Family history of cardiac arrest 12/28/2012  . Knee pain, chronic 12/28/2012  . Non-compliance with treatment 12/28/2012  . Palpitations 12/28/2012  . Tests ordered 12/25/2012    Past Surgical History:  Procedure Laterality Date  . CARDIAC CATHETERIZATION  06/2013   [facility]  . PORTA CATH INSERTION N/A 11/27/2016   Procedure: Glori Luis Cath Insertion;  Surgeon: Katha Cabal, MD;  Location: [facility] CV [facility];  Service: Cardiovascular;  Laterality: N/A;  . SUPRAVENTRICULAR TACHYCARDIA ABLATION N/A 04/22/2014   Procedure: SUPRAVENTRICULAR TACHYCARDIA ABLATION;  Surgeon: Evans Lance, MD;  Location: [facility] CATH LAB;  Service: Cardiovascular;  Laterality: N/A;  . TONSILLECTOMY    . TUBAL LIGATION      Prior to Admission medications  Medication Sig Start Date End Date Taking? Authorizing Provider  lisinopril (PRINIVIL,ZESTRIL) 10 MG tablet Take 10 mg by mouth daily.   Yes [provider]  omeprazole (PRILOSEC) 20 MG capsule Take 40 mg by mouth daily.   Yes [provider]   ibuprofen (ADVIL,MOTRIN) 200 MG tablet Take 200 mg by mouth every 6 (six) hours as needed.    [provider]  lidocaine-prilocaine (EMLA) cream Apply to affected area once 11/27/16   Lloyd Huger, MD  magic mouthwash w/lidocaine SOLN Take 5 mLs by mouth 3 (three) times daily as needed. May swish and swallow. Patient not taking: Reported on 01/30/2017 12/09/16   Lloyd Huger, MD  ondansetron (ZOFRAN) 8 MG tablet Take 1 tablet (8 mg total) by mouth 2 (two) times daily as needed. Start on the third day after cisplatin chemotherapy. 12/16/16   Lloyd Huger, MD  prochlorperazine (COMPAZINE) 10 MG tablet Take 1 tablet (10 mg total) by mouth every 6 (six) hours as needed (Nausea or vomiting). 12/16/16   Lloyd Huger, MD  sucralfate (CARAFATE) 1 g tablet Take 1 tablet (1 g total) by mouth 2 (two) times daily. Dissolve tablet in water then drink. Patient not taking: Reported on 01/30/2017 12/02/16   Lloyd Huger, MD  traMADol (ULTRAM) 50 MG tablet Take 1 tablet (50 mg total) by mouth 3 (three) times daily as needed. Patient not taking: Reported on 02/01/2017 11/12/16   Lloyd Huger, MD    Allergies Bee venom; Codeine; Sulfa antibiotics; Penicillin g; and Shellfish allergy  Family History  Problem Relation Age of Onset  . Stroke Mother   . Hypertension Mother   . Heart attack Father   . Thyroid cancer Sister   . Stroke Sister     Social History Social History  Substance Use Topics  . Smoking status: Current Some Day Smoker    Years: 30.00    Types: Cigarettes  . Smokeless tobacco: Never Used  . Alcohol use No    Review of Systems Constitutional: No fever/chills Eyes: No visual changes. ENT: No sore throat. No stiff neck no neck pain Cardiovascular: Denies chest pain. Respiratory: Denies shortness of breath. Gastrointestinal:   Positive vomiting.  No diarrhea.  No constipation. Genitourinary: Negative for dysuria. Musculoskeletal: Negative  lower extremity swelling Skin: Negative for rash. Neurological: Negative for severe headaches, focal weakness or numbness.   ____________________________________________   PHYSICAL EXAM:  VITAL SIGNS: ED Triage Vitals [02/01/17 0939]  Enc Vitals Group     BP (!) 172/113     Pulse Rate (!) 134     Resp 18     Temp 99.2 F (37.3 C)     Temp Source Oral     SpO2 95 %     Weight 145 lb (65.8 kg)     Height 5\' 5"  (1.651 m)     Head Circumference      Peak Flow      Pain Score 9     Pain Loc      Pain Edu?      Excl. in [place]?     Constitutional: Alert and oriented. Well appearing and in no acute distress.Quite anxious but otherwise in no acute distress Eyes: Conjunctivae are normal Head: Atraumatic HEENT: No congestion/rhinnorhea. Mucous membranes are moist.  Oropharynx non-erythematous Neck:   Nontender with no meningismus, no masses, no stridor Cardiovascular: Normal rate, regular rhythm. Grossly normal heart sounds.  Good peripheral circulation. Respiratory: Normal respiratory effort.  No  retractions. Lungs CTAB. Abdominal: Soft and nontender. No distention. No guarding no rebound Back:  There is no focal tenderness or step off.  there is no midline tenderness there are no lesions noted. there is no CVA tenderness Musculoskeletal: No lower extremity tenderness, no upper extremity tenderness. No joint effusions, no DVT signs strong distal pulses no edema Neurologic:  Normal speech and language. No gross focal neurologic deficits are appreciated.  Skin:  Skin is warm, dry and intact. No rash noted. Psychiatric: Mood and affect are anxious. Speech and behavior are normal.  ____________________________________________   LABS (all labs ordered are listed, but only abnormal results are displayed)  Labs Reviewed  COMPREHENSIVE METABOLIC PANEL - Abnormal; Notable for the following:       Result Value   Glucose, Bld 104 (*)    Alkaline Phosphatase 203 (*)    All other  components within normal limits  CBC WITH DIFFERENTIAL/PLATELET - Abnormal; Notable for the following:    RBC 3.53 (*)    Hemoglobin 11.8 (*)    HCT 33.9 (*)    RDW 20.1 (*)    Neutro Abs 7.4 (*)    All other components within normal limits  LIPASE, BLOOD  URINALYSIS, COMPLETE (UACMP) WITH MICROSCOPIC  TROPONIN I   ____________________________________________  EKG  I personally interpreted any EKGs ordered by me or triage Sinus tach rate 121, no acute ST elevation or depression normal axis unremarkable EKG aside from mild tachycardia ____________________________________________  RADIOLOGY  I reviewed any imaging ordered by me or triage that were performed during my shift and, if possible, patient and/or family made aware of any abnormal findings. ____________________________________________   PROCEDURES  Procedure(s) performed: None  Procedures  Critical Care performed: None  ____________________________________________   INITIAL IMPRESSION / ASSESSMENT AND PLAN / ED COURSE  Pertinent labs & imaging results that were available during my care of the patient were reviewed by me and considered in my medical decision making (see chart for details). Patient very anxious and upset about her blood pressure particular, she has had chronic nausea and recurrent vomiting with her chemotherapy. She is 3 weeks out from her last chemotherapy, but she did vomit this morning. She denies any other symptoms of focal neurologic deficit or urinary infection or anything else. She denies any chest pain or short of breath. Her only complaint is that she was vomiting and she has had a headache. She is neurologically intact. We are giving her Tylenol initially for headache and is my hope that she calms down her blood pressure will start to come down. This is somewhat tricky to manage because aggressive blood pressure management for the normal neurologic exam for subjective finding of a headache could  result in a significant pathology given that the patient is already had 2 doses of her home blood pressure medication and then vomited shortly thereafter I have no idea how much was absorbed. Therefore, I don't know how much blood pressure medication she has had and she has had a history of low blood pressure recently. We will therefore take this and this alone stepwise manner the patient is understanding of this. I will give her fentanyl as she is on chronic pain medications and I think this will help her pain. This may also help her blood pressure come down which is certainly in some ways likely also reactive to her anxiety and discomfort. She has no evidence of significant intracranial pathology such as head bleed but we did do a CT scan  which is negative and her blood work thus far as reassuring. She is no longer nauseated all this she was vomiting at the time of her initial blood pressure. We will give her IV fluids and we'll continue to monitor her closely here. She has a normal kidney function at this time which is quite reassuring. Patient understands our plan and is in agreement with how we are progressing. Has had some headache improvement with Tylenol. I don't feel LP is indicated at this time.  ----------------------------------------- 12:07 PM on 02/01/2017 -----------------------------------------  Blood pressure trending down, we will give labetalol given troponin 0.09. Patient with no chest pain or shortness of breath. However given these findings likely will require admission for IV hydration, blood pressure control, antiemetics, and further evaluation of troponin.    ____________________________________________   FINAL CLINICAL IMPRESSION(S) / ED DIAGNOSES  Final diagnoses:  None      This chart was dictated using voice recognition software.  Despite best efforts to proofread,  errors can occur which can change meaning.      Schuyler Amor, MD 02/01/17 1127    Schuyler Amor, MD 02/01/17 [phone]

## 2017-02-01 NOTE — ED Notes (Signed)
Megan, RN called to transport pt to the floor

## 2017-02-01 NOTE — H&P (Signed)
Latasha [name] is an 61 y.o. female.   Chief Complaint: headache. HPI: this is a 61 year old female has a history of a neuroendocrine carcinoma and is undergoing chemotherapy. Today she had a headache and her blood pressure was elevated. She didn't had vomiting 3 times with no nausea. She denies any chest pain. Said her blood pressure was elevated a few days ago at chemotherapy and she was restarted on her lisinopril. She says her headache feels a little bit better since getting some treatment. She was found to have a mildly elevated troponin. The hospitalist services were consulted for admission.  Past Medical History:  Diagnosis Date  . Cancer ([place])   . Coronary artery disease    Cardiac catheterization in January of 2015 showed mild nonobstructive three-vessel coronary artery disease with significant mid LAD spasm which improved with nitroglycerin. Metoprolol was switched to diltiazem  . Hyperlipidemia   . Hypertension   . PSVT (paroxysmal supraventricular tachycardia) ([place])     Past Surgical History:  Procedure Laterality Date  . CARDIAC CATHETERIZATION  06/2013   [facility]  . PORTA CATH INSERTION N/A 11/27/2016   Procedure: Glori Luis Cath Insertion;  Surgeon: Katha Cabal, MD;  Location: [facility];  Service: Cardiovascular;  Laterality: N/A;  . SUPRAVENTRICULAR TACHYCARDIA ABLATION N/A 04/22/2014   Procedure: SUPRAVENTRICULAR TACHYCARDIA ABLATION;  Surgeon: Evans Lance, MD;  Location: [facility] CATH LAB;  Service: Cardiovascular;  Laterality: N/A;  . TONSILLECTOMY    . TUBAL LIGATION      Family History  Problem Relation Age of Onset  . Stroke Mother   . Hypertension Mother   . Heart attack Father   . Thyroid cancer Sister   . Stroke Sister    Social History:  reports that she has been smoking Cigarettes.  She has smoked for the past 30.00 years. She has never used smokeless tobacco. She reports that she does not drink alcohol or use drugs.  Allergies:  Allergies   Allergen Reactions  . Bee Venom Swelling  . Codeine Nausea And Vomiting    Pruritic rash  . Sulfa Antibiotics Other (See Comments)    Other reaction(s): Pruritic rash  . Penicillin G Rash    Has patient had a PCN reaction causing immediate rash, facial/tongue/throat swelling, SOB or lightheadedness with hypotension: Yes Has patient had a PCN reaction causing severe rash involving mucus membranes or skin necrosis: No Has patient had a PCN reaction that required hospitalization No Has patient had a PCN reaction occurring within the last 10 years: No If all of the above answers are "NO", then may proceed with Cephalosporin use.   . Shellfish Allergy Rash     (Not in a hospital admission)  Results for orders placed or performed during the hospital encounter of 02/01/17 (from the past 48 hour(s))  Comprehensive metabolic panel     Status: Abnormal   Collection Time: 02/01/17  9:48 AM  Result Value Ref Range   Sodium 135 135 - 145 mmol/L   Potassium 4.2 3.5 - 5.1 mmol/L   Chloride 101 101 - 111 mmol/L   CO2 25 22 - 32 mmol/L   Glucose, Bld 104 (H) 65 - 99 mg/dL   BUN 6 6 - 20 mg/dL   Creatinine, Ser 0.83 0.44 - 1.00 mg/dL   Calcium 9.5 8.9 - 10.3 mg/dL   Total Protein 7.0 6.5 - 8.1 g/dL   Albumin 3.5 3.5 - 5.0 g/dL   AST 41 15 - 41 U/L   ALT  15 14 - 54 U/L   Alkaline Phosphatase 203 (H) 38 - 126 U/L   Total Bilirubin 0.6 0.3 - 1.2 mg/dL   GFR calc non Af Amer >60 >60 mL/min   GFR calc Af Amer >60 >60 mL/min    Comment: (NOTE) The eGFR has been calculated using the CKD EPI equation. This calculation has not been validated in all clinical situations. eGFR's persistently <60 mL/min signify possible Chronic Kidney Disease.    Anion gap 9 5 - 15  CBC with Differential     Status: Abnormal   Collection Time: 02/01/17  9:48 AM  Result Value Ref Range   WBC 9.9 3.6 - 11.0 K/uL   RBC 3.53 (L) 3.80 - 5.20 MIL/uL   Hemoglobin 11.8 (L) 12.0 - 16.0 g/dL   HCT 33.9 (L) 35.0 - 47.0 %    MCV 95.9 80.0 - 100.0 fL   MCH 33.4 26.0 - 34.0 pg   MCHC 34.8 32.0 - 36.0 g/dL   RDW 20.1 (H) 11.5 - 14.5 %   Platelets 317 150 - 440 K/uL   Neutrophils Relative % 75 %   Neutro Abs 7.4 (H) 1.4 - 6.5 K/uL   Lymphocytes Relative 18 %   Lymphs Abs 1.8 1.0 - 3.6 K/uL   Monocytes Relative 6 %   Monocytes Absolute 0.6 0.2 - 0.9 K/uL   Eosinophils Relative 0 %   Eosinophils Absolute 0.0 0 - 0.7 K/uL   Basophils Relative 1 %   Basophils Absolute 0.1 0 - 0.1 K/uL  Lipase, blood     Status: None   Collection Time: 02/01/17  9:48 AM  Result Value Ref Range   Lipase 23 11 - 51 U/L  Troponin I     Status: Abnormal   Collection Time: 02/01/17  9:48 AM  Result Value Ref Range   Troponin I 0.09 (HH) <0.03 ng/mL    Comment: CRITICAL RESULT CALLED TO, READ BACK BY AND VERIFIED WITH ANGELA ROBBINS @ 1156 02/01/17 BY [facility]   Urinalysis, Complete w Microscopic     Status: Abnormal   Collection Time: 02/01/17 11:44 AM  Result Value Ref Range   Color, Urine STRAW (A) YELLOW   APPearance CLEAR (A) CLEAR   Specific Gravity, Urine 1.006 1.005 - 1.030   pH 7.0 5.0 - 8.0   Glucose, UA NEGATIVE NEGATIVE mg/dL   Hgb urine dipstick NEGATIVE NEGATIVE   Bilirubin Urine NEGATIVE NEGATIVE   Ketones, ur NEGATIVE NEGATIVE mg/dL   Protein, ur NEGATIVE NEGATIVE mg/dL   Nitrite NEGATIVE NEGATIVE   Leukocytes, UA NEGATIVE NEGATIVE   RBC / HPF NONE SEEN 0 - 5 RBC/hpf   WBC, UA 0-5 0 - 5 WBC/hpf   Bacteria, UA NONE SEEN NONE SEEN   Squamous Epithelial / LPF NONE SEEN NONE SEEN   Ct Head Wo Contrast  Result Date: 02/01/2017 CLINICAL DATA:  Hypertension and headache. EXAM: CT HEAD WITHOUT CONTRAST TECHNIQUE: Contiguous axial images were obtained from the base of the skull through the vertex without intravenous contrast. COMPARISON:  None. FINDINGS: Brain: No evidence of acute infarction, hemorrhage, hydrocephalus, extra-axial collection or mass lesion/mass effect. Vascular: No hyperdense vessel or unexpected  calcification. Skull: Normal. Negative for fracture or focal lesion. Sinuses/Orbits: No acute finding. Other: None. IMPRESSION: No acute abnormality. No cause for the patient's symptoms identified. Electronically Signed   By: Dorise Bullion III M.D   On: 02/01/2017 10:46   Dg Chest Port 1 View  Result Date: 02/01/2017 CLINICAL DATA:  Elevated  blood pressure.  Vomiting. EXAM: PORTABLE CHEST 1 VIEW COMPARISON:  July 03, 2013 FINDINGS: A right Port-A-Cath is in good position. No pneumothorax. Minimal atelectasis in the bases. No nodules or masses. No focal infiltrates. The cardiomediastinal silhouette is unremarkable. IMPRESSION: No active disease. Electronically Signed   By: Dorise Bullion III M.D   On: 02/01/2017 12:39    Review of Systems  Constitutional: Negative for chills and fever.  HENT: Negative for hearing loss.   Eyes: Negative for blurred vision.  Respiratory: Negative for cough and shortness of breath.   Cardiovascular: Negative for chest pain.  Gastrointestinal: Positive for vomiting. Negative for abdominal pain.  Genitourinary: Negative for dysuria.  Musculoskeletal: Negative for myalgias.  Skin: Negative for rash.  Neurological: Negative for dizziness.    Blood pressure (!) 163/101, pulse 90, temperature 99.2 F (37.3 C), temperature source Oral, resp. rate 19, height 5' 5"  (1.651 m), weight 65.8 kg (145 lb), SpO2 94 %. Physical Exam  Constitutional: She is oriented to person, place, and time. She appears well-developed and well-nourished. No distress.  HENT:  Head: Normocephalic.  Oral mucosa dry  Eyes: Pupils are equal, round, and reactive to light. Conjunctivae are normal. No scleral icterus.  Neck: Neck supple. No JVD present. No thyromegaly present.  Cardiovascular:  No murmur heard. Tachycardic with no murmurs  Respiratory: Breath sounds normal. No respiratory distress. She exhibits no tenderness.  GI: Soft. Bowel sounds are normal. There is no tenderness.   Musculoskeletal: She exhibits no edema or tenderness.  Neurological: She is alert and oriented to person, place, and time. No cranial nerve deficit.  Skin: Skin is warm and dry.     Assessment/Plan 1. Elevated troponin. Suspect this is probably demand ischemia. However with the associated blood pressure spike and vomiting this could be her anginal equivalent. We'll go ahead and add aspirin beta blocker to her medication. Will not anticoagulate unless further elevates. We'll go ahead and consult cardiology. She sees Dr. Ubaldo Glassing as an outpatient. 2. Vomiting.name symptoms of nausea. We'll give her antiemetics when necessary 3. Tachycardia. EKG has sinus tachycardia. She does have a history of supraventricular tachycardia. She looks a little bit dehydrated so we'll give fluids which may slow her heart rate. 4. Coronary artery disease. She has a history of this. We'll go ahead and add aspirin on her medication list along with beta blocker. 5. Neuroendocrine carcinoma. She has widespread metastasis and is getting palliative chemotherapy.  Total time spent 45 minutes  Baxter Hire, MD 02/01/2017, 1:18 PM

## 2017-02-01 NOTE — Consult Note (Signed)
[facility]  CARDIOLOGY CONSULT NOTE  Patient ID: Latasha [name] MRN: [id number] DOB/AGE: 1956/05/15 61 y.o.  Admit date: 02/01/2017 Referring Physician Edwina Barth Primary Physician  Primary Cardiologist Fath Reason for Consultation Borderline elevated troponin  HPI: 61 year old female referred for evaluation of borderline elevated troponin. 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 The patient developed headache, nausea and vomiting today noted to have elevated blood pressure. She presented to [facility] emergency room where ECG was nondiagnostic. Initial troponin was borderline elevated 0.09. Follow-up troponin was less than 0.03. The patient denies chest pain shortness of breath. She undergone previous cardiac catheterization 07/05/2013 which revealed insignificant coronary disease. The patient has known history of paroxysmal SVT, status post successful ablation. Patient not experiencing SVT.  Review of systems complete and found to be negative unless listed above     Past Medical History:  Diagnosis Date  . Cancer ([place])   . Coronary artery disease    Cardiac catheterization in January of 2015 showed mild nonobstructive three-vessel coronary artery disease with significant mid LAD spasm which improved with nitroglycerin. Metoprolol was switched to diltiazem  . Hyperlipidemia   . Hypertension   . PSVT (paroxysmal supraventricular tachycardia) ([place])     Past Surgical History:  Procedure Laterality Date  . CARDIAC CATHETERIZATION  06/2013   [facility]  . PORTA CATH INSERTION N/A 11/27/2016   Procedure: Glori Luis Cath Insertion;  Surgeon: Katha Cabal, MD;  Location: [facility];  Service: Cardiovascular;  Laterality: N/A;  . SUPRAVENTRICULAR TACHYCARDIA ABLATION N/A 04/22/2014   Procedure: SUPRAVENTRICULAR TACHYCARDIA ABLATION;  Surgeon: Evans Lance, MD;  Location: [facility] CATH LAB;  Service:  Cardiovascular;  Laterality: N/A;  . TONSILLECTOMY    . TUBAL LIGATION      Prescriptions Prior to Admission  Medication Sig Dispense Refill Last Dose  . lisinopril (PRINIVIL,ZESTRIL) 10 MG tablet Take 10 mg by mouth daily.   01/31/2017 at 0800  . omeprazole (PRILOSEC) 20 MG capsule Take 40 mg by mouth daily.   01/31/2017 at 0800  . ibuprofen (ADVIL,MOTRIN) 200 MG tablet Take 200 mg by mouth every 6 (six) hours as needed.   prn at prn  . lidocaine-prilocaine (EMLA) cream Apply to affected area once 30 g 3 prn at prn  . magic mouthwash w/lidocaine SOLN Take 5 mLs by mouth 3 (three) times daily as needed. May swish and swallow. (Patient not taking: Reported on 01/30/2017) 300 mL 2 Not Taking at Unknown time  . ondansetron (ZOFRAN) 8 MG tablet Take 1 tablet (8 mg total) by mouth 2 (two) times daily as needed. Start on the third day after cisplatin chemotherapy. 60 tablet 2 prn at prn  . prochlorperazine (COMPAZINE) 10 MG tablet Take 1 tablet (10 mg total) by mouth every 6 (six) hours as needed (Nausea or vomiting). 60 tablet 2 prn at prn  . sucralfate (CARAFATE) 1 g tablet Take 1 tablet (1 g total) by mouth 2 (two) times daily. Dissolve tablet in water then drink. (Patient not taking: Reported on 01/30/2017) 60 tablet 2 Not Taking at Unknown time  . traMADol (ULTRAM) 50 MG tablet Take 1 tablet (50 mg total) by mouth 3 (three) times daily as needed. (Patient not taking: Reported on 02/01/2017) 60 tablet 0 Not Taking at prn   Social History   Social History  . Marital status: Married    Spouse name: N/A  . Number of children: N/A  . Years of education: N/A  Occupational History  . Not on file.   Social History Main Topics  . Smoking status: Current Some Day Smoker    Years: 30.00    Types: Cigarettes  . Smokeless tobacco: Never Used  . Alcohol use No  . Drug use: No  . Sexual activity: Not on file   Other Topics Concern  . Not on file   Social History Narrative  . No narrative on file     Family History  Problem Relation Age of Onset  . Stroke Mother   . Hypertension Mother   . Heart attack Father   . Thyroid cancer Sister   . Stroke Sister       Review of systems complete and found to be negative unless listed above      PHYSICAL EXAM  General: Well developed, well nourished, in no acute distress HEENT:  Normocephalic and atramatic Neck:  No JVD.  Lungs: Clear bilaterally to auscultation and percussion. Heart: HRRR . Normal S1 and S2 without gallops or murmurs.  Abdomen: Bowel sounds are positive, abdomen soft and non-tender  Msk:  Back normal, normal gait. Normal strength and tone for age. Extremities: No clubbing, cyanosis or edema.   Neuro: Alert and oriented X 3. Psych:  Good affect, responds appropriately  Labs:   Lab Results  Component Value Date   WBC 9.9 02/01/2017   HGB 11.8 (L) 02/01/2017   HCT 33.9 (L) 02/01/2017   MCV 95.9 02/01/2017   PLT 317 02/01/2017    Recent Labs Lab 02/01/17 0948  NA 135  K 4.2  CL 101  CO2 25  BUN 6  CREATININE 0.83  CALCIUM 9.5  PROT 7.0  BILITOT 0.6  ALKPHOS 203*  ALT 15  AST 41  GLUCOSE 104*   Lab Results  Component Value Date   CKTOTAL 79 07/04/2013   CKMB 4.8 (H) 07/04/2013   TROPONINI <0.03 02/01/2017   No results found for: CHOL No results found for: HDL No results found for: LDLCALC No results found for: TRIG No results found for: CHOLHDL No results found for: LDLDIRECT    Radiology: Ct Head Wo Contrast  Result Date: 02/01/2017 CLINICAL DATA:  Hypertension and headache. EXAM: CT HEAD WITHOUT CONTRAST TECHNIQUE: Contiguous axial images were obtained from the base of the skull through the vertex without intravenous contrast. COMPARISON:  None. FINDINGS: Brain: No evidence of acute infarction, hemorrhage, hydrocephalus, extra-axial collection or mass lesion/mass effect. Vascular: No hyperdense vessel or unexpected calcification. Skull: Normal. Negative for fracture or focal lesion.  Sinuses/Orbits: No acute finding. Other: None. IMPRESSION: No acute abnormality. No cause for the patient's symptoms identified. Electronically Signed   By: Dorise Bullion III M.D   On: 02/01/2017 10:46   US Abdomen Complete  Result Date: 01/15/2017 CLINICAL DATA:  Malignant neoplasm of upper lobe of left lung. EXAM: ABDOMEN ULTRASOUND COMPLETE COMPARISON:  PET scan of November 25, 2016.  Ultrasound of Nov 04, 2016. FINDINGS: Gallbladder: No gallstones or wall thickening visualized. No sonographic Murphy sign noted by sonographer. Common bile duct: Diameter: 5.9 mm which is within normal limits. Liver: Multiple rounded lesions are noted in the hepatic parenchyma concerning for metastatic disease. The largest lesion 3.2 cm in diameter. IVC: No abnormality visualized. Pancreas: Not well visualized due to overlying bowel gas. Spleen: Size and appearance within normal limits. Right Kidney: Length: 10.2 cm. Echogenicity within normal limits. No mass or hydronephrosis visualized. Left Kidney: Length: 11.1 cm. 8 mm cyst is seen in upper pole. Echogenicity within  normal limits. No mass or hydronephrosis visualized. Abdominal aorta: No aneurysm visualized. Other findings: None. IMPRESSION: Multiple hepatic lesions are noted concerning for metastatic disease. Pancreas is not well visualized due to overlying bowel gas. Electronically Signed   By: Marijo Conception, M.D.   On: 01/15/2017 10:02   Dg Chest Port 1 View  Result Date: 02/01/2017 CLINICAL DATA:  Elevated blood pressure.  Vomiting. EXAM: PORTABLE CHEST 1 VIEW COMPARISON:  July 03, 2013 FINDINGS: A right Port-A-Cath is in good position. No pneumothorax. Minimal atelectasis in the bases. No nodules or masses. No focal infiltrates. The cardiomediastinal silhouette is unremarkable. IMPRESSION: No active disease. Electronically Signed   By: Dorise Bullion III M.D   On: 02/01/2017 12:39    EKG: Normal sinus rhythm  ASSESSMENT AND PLAN:   1. Borderline elevated  troponin, without trend, follow-up troponin normal, likely demand supply ischemia, not due to acute coronary syndrome, especially in light of recent cardiac catheterization revealing insignificant coronary artery disease 2. Paroxysmal SVT, status post ablation, quiescent without recurrence 3. Elevated blood pressure, in the setting of headache, nausea, vomiting with underlying metastatic neuroendocrine carcinoma, undergoing chemotherapy  Recommendations  1. Continue current therapy 2. Defer full dose anticoagulation 3. Defer further cardiac diagnostics 4. Uptitrate metoprolol tartrate as needed  Signed: Isaias Cowman MD,PhD, [facility] 02/01/2017, 5:04 PM

## 2017-02-01 NOTE — ED Notes (Signed)
Patient transported to CT 

## 2017-02-01 NOTE — ED Notes (Signed)
Pt ambulatory to restroom without difficulty.

## 2017-02-01 NOTE — ED Notes (Signed)
Pt states that she has cancer and is on chemo, pt has been vomiting, this morning she was unable to hold down her blood pressure medication. Pt also reports having a headache this morning that she thinks woke her up from her sleep. Pt states that she has gotten headaches before when her blood pressure was elevated. Pt has note taken any medication for the headache today.

## 2017-02-01 NOTE — ED Notes (Signed)
Pt returned from CT °

## 2017-02-01 NOTE — ED Triage Notes (Signed)
Pt arrives to ER via POV c/o hypertension and headache that awoke her from sleep today. Pt takes BP medications, unable to keep down due to emesis this AM.

## 2017-02-02 DIAGNOSIS — R740 Nonspecific elevation of levels of transaminase and lactic acid dehydrogenase [LDH]: Secondary | ICD-10-CM

## 2017-02-02 DIAGNOSIS — I10 Essential (primary) hypertension: Secondary | ICD-10-CM

## 2017-02-02 DIAGNOSIS — D649 Anemia, unspecified: Secondary | ICD-10-CM

## 2017-02-02 DIAGNOSIS — I674 Hypertensive encephalopathy: Secondary | ICD-10-CM

## 2017-02-02 DIAGNOSIS — R112 Nausea with vomiting, unspecified: Secondary | ICD-10-CM

## 2017-02-02 DIAGNOSIS — R7401 Elevation of levels of liver transaminase levels: Secondary | ICD-10-CM

## 2017-02-02 DIAGNOSIS — R000 Tachycardia, unspecified: Secondary | ICD-10-CM

## 2017-02-02 LAB — TSH: TSH: 4.541 u[IU]/mL — AB (ref 0.350–4.500)

## 2017-02-02 LAB — TROPONIN I

## 2017-02-02 LAB — HIV ANTIBODY (ROUTINE TESTING W REFLEX): HIV SCREEN 4TH GENERATION: NONREACTIVE

## 2017-02-02 MED ORDER — METOPROLOL TARTRATE 50 MG PO TABS
50.0000 mg | ORAL_TABLET | Freq: Two times a day (BID) | ORAL | Status: DC
  Administered 2017-02-02: 50 mg via ORAL
  Filled 2017-02-02: qty 1

## 2017-02-02 MED ORDER — METOPROLOL TARTRATE 50 MG PO TABS: 50.0000 mg | ORAL_TABLET | Freq: Two times a day (BID) | ORAL | 3 refills | Status: AC

## 2017-02-02 NOTE — Discharge Summary (Signed)
[facility] [place] NAME: Latasha [name]    MR#:  [id number]  DATE OF BIRTH:  1956/02/25  DATE OF ADMISSION:  02/01/2017 ADMITTING PHYSICIAN: Baxter Hire, MD  DATE OF DISCHARGE: 02/02/2017  1:07 PM  PRIMARY CARE PHYSICIAN: Barbaraann Boys, MD     ADMISSION DIAGNOSIS:  Tachycardia [R00.0] Hypertensive urgency [I16.0]  DISCHARGE DIAGNOSIS:  Active Problems:   Demand ischemia ([facility])   Nausea and vomiting   Malignant essential hypertension   Hypertensive encephalopathy   Elevated transaminase level   Anemia   Sinus tachycardia   SECONDARY DIAGNOSIS:   Past Medical History:  Diagnosis Date  . Cancer ([place])   . Coronary artery disease    Cardiac catheterization in January of 2015 showed mild nonobstructive three-vessel coronary artery disease with significant mid LAD spasm which improved with nitroglycerin. Metoprolol was switched to diltiazem  . Hyperlipidemia   . Hypertension   . PSVT (paroxysmal supraventricular tachycardia) ([place])     .pro HOSPITAL COURSE:   The patient  is 61 year old Caucasian female with medical history significant for history of stage IV large cell neuroendocrine carcinoma, coronary artery disease, paroxysmal SVT, hypertension, hyperlipidemia, who presented to the hospital with complaints of headache, nausea, vomiting, elevated blood pressure. Initial EKG was unremarkable. Labs revealed elevated troponin to 0.09 however, follow-up troponin was less than 0.03. Patient had no chest pains. She had cardiac catheterization in January 2015, which revealed insignificant coronary artery disease. She was admitted to the hospital for further evaluation, treatment, initiated on metoprolol, was seen by cardiologist. Cardiologist recommended no further testing, but conservative management of her tachycardia and blood pressure.the patient was advised to follow-up with primary care physician, primary cardiologist  for further recommendations Discussion by problem: #1 nausea and vomiting, resolved and, possibly  Related to malignant essential hypertension, she was able to eat well on the day of discharge, had no nausea and vomiting #2. Malignant essential hypertension, patient is to continue lisinopril, metoprolol, medications to be advanced depending on patient's blood pressure needs #3 demand ischemia, no further interventions, patient is to continue metoprolol #4. Elevated transaminase level, resolved #5. Anemia, seems to be stable #6. History of SVT, no SVT during her hospitalization, patient is to continue metoprolol. TSH is pending.   DISCHARGE CONDITIONS:   stable  CONSULTS OBTAINED:  Treatment Team:  Isaias Cowman, MD  DRUG ALLERGIES:   Allergies  Allergen Reactions  . Bee Venom Swelling  . Codeine Nausea And Vomiting    Pruritic rash  . Sulfa Antibiotics Other (See Comments)    Other reaction(s): Pruritic rash  . Penicillin G Rash    Has patient had a PCN reaction causing immediate rash, facial/tongue/throat swelling, SOB or lightheadedness with hypotension: Yes Has patient had a PCN reaction causing severe rash involving mucus membranes or skin necrosis: No Has patient had a PCN reaction that required hospitalization No Has patient had a PCN reaction occurring within the last 10 years: No If all of the above answers are "NO", then may proceed with Cephalosporin use.   . Shellfish Allergy Rash    DISCHARGE MEDICATIONS:   Discharge Medication List as of 02/02/2017 11:37 AM    START taking these medications   Details  metoprolol tartrate (LOPRESSOR) 50 MG tablet Take 1 tablet (50 mg total) by mouth 2 (two) times daily., Starting Sun 02/02/2017, Normal      CONTINUE these medications which have NOT CHANGED   Details  lisinopril (PRINIVIL,ZESTRIL)  10 MG tablet Take 10 mg by mouth daily., Historical Med    omeprazole (PRILOSEC) 20 MG capsule Take 40 mg by mouth daily.,  Historical Med    ibuprofen (ADVIL,MOTRIN) 200 MG tablet Take 200 mg by mouth every 6 (six) hours as needed., Historical Med    lidocaine-prilocaine (EMLA) cream Apply to affected area once, Normal    ondansetron (ZOFRAN) 8 MG tablet Take 1 tablet (8 mg total) by mouth 2 (two) times daily as needed. Start on the third day after cisplatin chemotherapy., Starting Mon 12/16/2016, Normal    prochlorperazine (COMPAZINE) 10 MG tablet Take 1 tablet (10 mg total) by mouth every 6 (six) hours as needed (Nausea or vomiting)., Starting Mon 12/16/2016, Normal      STOP taking these medications     magic mouthwash w/lidocaine SOLN      sucralfate (CARAFATE) 1 g tablet      traMADol (ULTRAM) 50 MG tablet          DISCHARGE INSTRUCTIONS:    The patient is to follow-up with primary care physician as outpatient  If you experience worsening of your admission symptoms, develop shortness of breath, life threatening emergency, suicidal or homicidal thoughts you must seek medical attention immediately by calling 911 or calling your MD immediately  if symptoms less severe.  You Must read complete instructions/literature along with all the possible adverse reactions/side effects for all the Medicines you take and that have been prescribed to you. Take any new Medicines after you have completely understood and accept all the possible adverse reactions/side effects.   Please note  You were cared for by a hospitalist during your hospital stay. If you have any questions about your discharge medications or the care you received while you were in the hospital after you are discharged, you can call the unit and asked to speak with the hospitalist on call if the hospitalist that took care of you is not available. Once you are discharged, your primary care physician will handle any further medical issues. Please note that NO REFILLS for any discharge medications will be authorized once you are discharged, as it is  imperative that you return to your primary care physician (or establish a relationship with a primary care physician if you do not have one) for your aftercare needs so that they can reassess your need for medications and monitor your lab values.    Today   CHIEF COMPLAINT:   Chief Complaint  Patient presents with  . Hypertension    HISTORY OF PRESENT ILLNESS:     VITAL SIGNS:  Blood pressure (!) 151/92, pulse (!) 111, temperature 98.1 F (36.7 C), temperature source Oral, resp. rate 19, height 5\' 5"  (1.651 m), weight 62.3 kg (137 lb 4.8 oz), SpO2 96 %.  I/O:   Intake/Output Summary (Last 24 hours) at 02/02/17 1358 Last data filed at 02/02/17 0924  Gross per 24 hour  Intake           453.33 ml  Output             2550 ml  Net         -2096.67 ml    PHYSICAL EXAMINATION:  GENERAL:  61 y.o.-year-old patient lying in the bed with no acute distress.  EYES: Pupils equal, round, reactive to light and accommodation. No scleral icterus. Extraocular muscles intact.  HEENT: Head atraumatic, normocephalic. Oropharynx and nasopharynx clear.  NECK:  Supple, no jugular venous distention. No thyroid enlargement, no tenderness.  LUNGS:  Normal breath sounds bilaterally, no wheezing, rales,rhonchi or crepitation. No use of accessory muscles of respiration.  CARDIOVASCULAR: S1, S2 normal. No murmurs, rubs, or gallops.  ABDOMEN: Soft, non-tender, non-distended. Bowel sounds present. No organomegaly or mass.  EXTREMITIES: No pedal edema, cyanosis, or clubbing.  NEUROLOGIC: Cranial nerves II through XII are intact. Muscle strength 5/5 in all extremities. Sensation intact. Gait not checked.  PSYCHIATRIC: The patient is alert and oriented x 3.  SKIN: No obvious rash, lesion, or ulcer.   DATA REVIEW:   CBC  Recent Labs Lab 02/01/17 0948  WBC 9.9  HGB 11.8*  HCT 33.9*  PLT 317    Chemistries   Recent Labs Lab 02/01/17 0948  NA 135  K 4.2  CL 101  CO2 25  GLUCOSE 104*  BUN 6    CREATININE 0.83  CALCIUM 9.5  AST 41  ALT 15  ALKPHOS 203*  BILITOT 0.6    Cardiac Enzymes  Recent Labs Lab 02/02/17 0431  TROPONINI <0.03    Microbiology Results  Results for orders placed or performed in visit on 07/31/11  Urine culture     Status: None   Collection Time: 07/31/11  8:13 PM  Result Value Ref Range Status   Micro Text Report   Final       SOURCE: CC    ORGANISM 1                25,000 CFU/ML ENTEROBACTER AEROGENES   ANTIBIOTIC                    ORG#1     CEFAZOLIN                     R         CEFTRIAXONE                   S         CIPROFLOXACIN                 S         GENTAMICIN                    S         IMIPENEM                      S         LEVOFLOXACIN                  S         NITROFURANTOIN                R         CEFOXITIN                     R         TRIMETHOPRIM/SULFAMETHOXAZOLE S             RADIOLOGY:  Ct Head Wo Contrast  Result Date: 02/01/2017 CLINICAL DATA:  Hypertension and headache. EXAM: CT HEAD WITHOUT CONTRAST TECHNIQUE: Contiguous axial images were obtained from the base of the skull through the vertex without intravenous contrast. COMPARISON:  None. FINDINGS: Brain: No evidence of acute infarction, hemorrhage, hydrocephalus, extra-axial collection or mass lesion/mass effect. Vascular: No hyperdense vessel or unexpected calcification. Skull: Normal. Negative for fracture or focal lesion. Sinuses/Orbits: No acute finding. Other: None. IMPRESSION: No acute abnormality.  No cause for the patient's symptoms identified. Electronically Signed   By: Dorise Bullion III M.D   On: 02/01/2017 10:46   Dg Chest Port 1 View  Result Date: 02/01/2017 CLINICAL DATA:  Elevated blood pressure.  Vomiting. EXAM: PORTABLE CHEST 1 VIEW COMPARISON:  July 03, 2013 FINDINGS: A right Port-A-Cath is in good position. No pneumothorax. Minimal atelectasis in the bases. No nodules or masses. No focal infiltrates. The cardiomediastinal silhouette  is unremarkable. IMPRESSION: No active disease. Electronically Signed   By: Dorise Bullion III M.D   On: 02/01/2017 12:39    EKG:   Orders placed or performed during the hospital encounter of 02/01/17  . EKG 12-Lead  . EKG 12-Lead  . ED EKG  . ED EKG      Management plans discussed with the patient, family and they are in agreement.  CODE STATUS:     Code Status Orders        Start     Ordered   02/01/17 1510  Full code  Continuous     02/01/17 1509    Code Status History    Date Active Date Inactive Code Status Order ID Comments User Context   04/22/2014 11:21 AM 04/22/2014  9:22 PM Full Code [id number]  Evans Lance, MD Inpatient      TOTAL TIME TAKING CARE OF THIS PATIENT: 40 minutes.    Theodoro Grist M.D on 02/02/2017 at 1:58 PM  Between 7am to 6pm - Pager - [phone]  After 6pm go to [email or web address] - password [email or web address]  Office  [phone]  CC: Primary care physician; Barbaraann Boys, MD

## 2017-02-02 NOTE — Discharge Instructions (Signed)

## 2017-02-02 NOTE — Progress Notes (Signed)
[facility]  SUBJECTIVE: The patient denies chest pain or shortness of breath   Vitals:   02/01/17 1807 02/01/17 1940 02/02/17 0515 02/02/17 0907  BP: (!) 167/94 (!) 154/86 (!) 154/89 (!) 151/92  Pulse:  97 96 (!) 111  Resp:  18 18 19   Temp:  98.6 F (37 C) 98.1 F (36.7 C)   TempSrc:  Oral Oral   SpO2:  95% 94% 96%  Weight:      Height:         Intake/Output Summary (Last 24 hours) at 02/02/17 4854 Last data filed at 02/02/17 6270  Gross per 24 hour  Intake           953.33 ml  Output             2550 ml  Net         -1596.67 ml      PHYSICAL EXAM  General: Well developed, well nourished, in no acute distress HEENT:  Normocephalic and atramatic Neck:  No JVD.  Lungs: Clear bilaterally to auscultation and percussion. Heart: HRRR . Normal S1 and S2 without gallops or murmurs.  Abdomen: Bowel sounds are positive, abdomen soft and non-tender  Msk:  Back normal, normal gait. Normal strength and tone for age. Extremities: No clubbing, cyanosis or edema.   Neuro: Alert and oriented X 3. Psych:  Good affect, responds appropriately   LABS: Basic Metabolic Panel:  Recent Labs  02/01/17 0948  NA 135  K 4.2  CL 101  CO2 25  GLUCOSE 104*  BUN 6  CREATININE 0.83  CALCIUM 9.5   Liver Function Tests:  Recent Labs  02/01/17 0948  AST 41  ALT 15  ALKPHOS 203*  BILITOT 0.6  PROT 7.0  ALBUMIN 3.5    Recent Labs  02/01/17 0948  LIPASE 23   CBC:  Recent Labs  02/01/17 0948  WBC 9.9  NEUTROABS 7.4*  HGB 11.8*  HCT 33.9*  MCV 95.9  PLT 317   Cardiac Enzymes:  Recent Labs  02/01/17 1551 02/01/17 2151 02/02/17 0431  TROPONINI <0.03 <0.03 <0.03   BNP: Invalid input(s): POCBNP D-Dimer: No results for input(s): DDIMER in the last 72 hours. Hemoglobin A1C: No results for input(s): HGBA1C in the last 72 hours. Fasting Lipid Panel: No results for input(s): CHOL, HDL, LDLCALC, TRIG, CHOLHDL, LDLDIRECT in the last 72 hours. Thyroid Function  Tests: No results for input(s): TSH, T4TOTAL, T3FREE, THYROIDAB in the last 72 hours.  Invalid input(s): FREET3 Anemia Panel: No results for input(s): VITAMINB12, FOLATE, FERRITIN, TIBC, IRON, RETICCTPCT in the last 72 hours.  Ct Head Wo Contrast  Result Date: 02/01/2017 CLINICAL DATA:  Hypertension and headache. EXAM: CT HEAD WITHOUT CONTRAST TECHNIQUE: Contiguous axial images were obtained from the base of the skull through the vertex without intravenous contrast. COMPARISON:  None. FINDINGS: Brain: No evidence of acute infarction, hemorrhage, hydrocephalus, extra-axial collection or mass lesion/mass effect. Vascular: No hyperdense vessel or unexpected calcification. Skull: Normal. Negative for fracture or focal lesion. Sinuses/Orbits: No acute finding. Other: None. IMPRESSION: No acute abnormality. No cause for the patient's symptoms identified. Electronically Signed   By: Dorise Bullion III M.D   On: 02/01/2017 10:46   Dg Chest Port 1 View  Result Date: 02/01/2017 CLINICAL DATA:  Elevated blood pressure.  Vomiting. EXAM: PORTABLE CHEST 1 VIEW COMPARISON:  July 03, 2013 FINDINGS: A right Port-A-Cath is in good position. No pneumothorax. Minimal atelectasis in the bases. No nodules or masses. No focal infiltrates. The  cardiomediastinal silhouette is unremarkable. IMPRESSION: No active disease. Electronically Signed   By: Dorise Bullion III M.D   On: 02/01/2017 12:39     Echo   TELEMETRY: Normal sinus rhythm:  ASSESSMENT AND PLAN:  Active Problems:   Elevated troponin    1. Borderline elevated troponin, with normal troponin 3, nonspecific elevation, not due to demand supply ischemia or acute coronary syndrome 2. Paroxysmal SVT, status post ablation, stable 3. Elevated blood pressure, in the setting of headache, nausea, vomiting, with underlying metastatic neuroendocrine carcinoma, undergoing chemotherapy  Recommendations  1. Continue current therapy 2. Defer further cardiac  diagnostics at this time  Signed off for now, please call if any questions   Isaias Cowman, MD, PhD, [facility] 02/02/2017 9:26 AM

## 2017-02-03 ENCOUNTER — Inpatient Hospital Stay: Payer: 59

## 2017-02-03 ENCOUNTER — Inpatient Hospital Stay (HOSPITAL_BASED_OUTPATIENT_CLINIC_OR_DEPARTMENT_OTHER): Payer: 59 | Admitting: Oncology

## 2017-02-03 ENCOUNTER — Encounter: Payer: Self-pay | Admitting: Oncology

## 2017-02-03 VITALS — BP 142/95 | HR 90 | Temp 98.1°F | Resp 18 | Ht 65.0 in | Wt 140.0 lb

## 2017-02-03 DIAGNOSIS — Z7689 Persons encountering health services in other specified circumstances: Secondary | ICD-10-CM | POA: Diagnosis not present

## 2017-02-03 DIAGNOSIS — I471 Supraventricular tachycardia: Secondary | ICD-10-CM | POA: Diagnosis not present

## 2017-02-03 DIAGNOSIS — Z808 Family history of malignant neoplasm of other organs or systems: Secondary | ICD-10-CM

## 2017-02-03 DIAGNOSIS — M25562 Pain in left knee: Secondary | ICD-10-CM

## 2017-02-03 DIAGNOSIS — C7A8 Other malignant neuroendocrine tumors: Secondary | ICD-10-CM

## 2017-02-03 DIAGNOSIS — G4730 Sleep apnea, unspecified: Secondary | ICD-10-CM | POA: Diagnosis not present

## 2017-02-03 DIAGNOSIS — Z79899 Other long term (current) drug therapy: Secondary | ICD-10-CM | POA: Diagnosis not present

## 2017-02-03 DIAGNOSIS — F17210 Nicotine dependence, cigarettes, uncomplicated: Secondary | ICD-10-CM | POA: Diagnosis not present

## 2017-02-03 DIAGNOSIS — R948 Abnormal results of function studies of other organs and systems: Secondary | ICD-10-CM

## 2017-02-03 DIAGNOSIS — I10 Essential (primary) hypertension: Secondary | ICD-10-CM | POA: Diagnosis not present

## 2017-02-03 DIAGNOSIS — I2510 Atherosclerotic heart disease of native coronary artery without angina pectoris: Secondary | ICD-10-CM | POA: Diagnosis not present

## 2017-02-03 LAB — CBC WITH DIFFERENTIAL/PLATELET
BASOS PCT: 1 %
Basophils Absolute: 0 10*3/uL (ref 0–0.1)
EOS ABS: 0 10*3/uL (ref 0–0.7)
EOS PCT: 1 %
HEMATOCRIT: 29 % — AB (ref 35.0–47.0)
Hemoglobin: 10.1 g/dL — ABNORMAL LOW (ref 12.0–16.0)
Lymphocytes Relative: 16 %
Lymphs Abs: 1.2 10*3/uL (ref 1.0–3.6)
MCH: 33.7 pg (ref 26.0–34.0)
MCHC: 34.9 g/dL (ref 32.0–36.0)
MCV: 96.7 fL (ref 80.0–100.0)
MONO ABS: 1 10*3/uL — AB (ref 0.2–0.9)
MONOS PCT: 13 %
Neutro Abs: 5.4 10*3/uL (ref 1.4–6.5)
Neutrophils Relative %: 69 %
PLATELETS: 264 10*3/uL (ref 150–440)
RBC: 2.99 MIL/uL — ABNORMAL LOW (ref 3.80–5.20)
RDW: 20.3 % — AB (ref 11.5–14.5)
WBC: 7.6 10*3/uL (ref 3.6–11.0)

## 2017-02-03 LAB — COMPREHENSIVE METABOLIC PANEL
ALBUMIN: 3.3 g/dL — AB (ref 3.5–5.0)
ALT: 16 U/L (ref 14–54)
ANION GAP: 10 (ref 5–15)
AST: 46 U/L — ABNORMAL HIGH (ref 15–41)
Alkaline Phosphatase: 171 U/L — ABNORMAL HIGH (ref 38–126)
BILIRUBIN TOTAL: 0.5 mg/dL (ref 0.3–1.2)
BUN: 9 mg/dL (ref 6–20)
CO2: 22 mmol/L (ref 22–32)
Calcium: 9.4 mg/dL (ref 8.9–10.3)
Chloride: 102 mmol/L (ref 101–111)
Creatinine, Ser: 1.01 mg/dL — ABNORMAL HIGH (ref 0.44–1.00)
GFR calc Af Amer: >60 mL/min (ref >60)
GFR calc non Af Amer: 59 mL/min — ABNORMAL LOW (ref >60)
GLUCOSE: 106 mg/dL — AB (ref 65–99)
POTASSIUM: 3.4 mmol/L — AB (ref 3.5–5.1)
Sodium: 134 mmol/L — ABNORMAL LOW (ref 135–145)
TOTAL PROTEIN: 6.7 g/dL (ref 6.5–8.1)

## 2017-02-03 MED ORDER — ETOPOSIDE CHEMO INJECTION 1 GM/50ML
50.0000 mg/m2 | Freq: Once | INTRAVENOUS | Status: AC
  Administered 2017-02-03: 90 mg via INTRAVENOUS
  Filled 2017-02-03: qty 4.5

## 2017-02-03 MED ORDER — HEPARIN SOD (PORK) LOCK FLUSH 100 UNIT/ML IV SOLN
500.0000 [IU] | Freq: Once | INTRAVENOUS | Status: AC | PRN
  Administered 2017-02-03: 500 [IU]
  Filled 2017-02-03: qty 5

## 2017-02-03 MED ORDER — SODIUM CHLORIDE 0.9% FLUSH
10.0000 mL | INTRAVENOUS | Status: DC | PRN
  Administered 2017-02-03: 10 mL
  Filled 2017-02-03: qty 10

## 2017-02-03 MED ORDER — POTASSIUM CHLORIDE 2 MEQ/ML IV SOLN
Freq: Once | INTRAVENOUS | Status: AC
  Administered 2017-02-03: 10:00:00 via INTRAVENOUS
  Filled 2017-02-03: qty 1000

## 2017-02-03 MED ORDER — SODIUM CHLORIDE 0.9 % IV SOLN
Freq: Once | INTRAVENOUS | Status: AC
  Administered 2017-02-03: 09:00:00 via INTRAVENOUS
  Filled 2017-02-03: qty 1000

## 2017-02-03 MED ORDER — CISPLATIN CHEMO INJECTION 100MG/100ML
80.0000 mg/m2 | Freq: Once | INTRAVENOUS | Status: AC
  Administered 2017-02-03: 138 mg via INTRAVENOUS
  Filled 2017-02-03: qty 50

## 2017-02-03 MED ORDER — DEXAMETHASONE SODIUM PHOSPHATE 10 MG/ML IJ SOLN: 10.0000 mg | Freq: Once | INTRAMUSCULAR | Status: DC

## 2017-02-03 MED ORDER — PALONOSETRON HCL INJECTION 0.25 MG/5ML
0.2500 mg | Freq: Once | INTRAVENOUS | Status: AC
Start: 2017-02-03 — End: 2017-02-03
  Administered 2017-02-03: 0.25 mg via INTRAVENOUS
  Filled 2017-02-03: qty 5

## 2017-02-03 MED ORDER — SODIUM CHLORIDE 0.9 % IV SOLN
Freq: Once | INTRAVENOUS | Status: AC
  Administered 2017-02-03: 12:00:00 via INTRAVENOUS
  Filled 2017-02-03: qty 5

## 2017-02-03 NOTE — Progress Notes (Signed)
Pt went to ER sat due to severe HA and b/p up.  She vomited there.  She got IVF and was put on metoprolol while she was there and it is twice a day. B/p elevated today but pt states it is better than in ER. Drinking and eating good, urinating fine

## 2017-02-03 NOTE — Progress Notes (Signed)
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  INTERVAL HISTORY: Patient returns to clinic today for further evaluation and consideration of cycle 4, day 1 of cisplatin and etoposide.  She was seen in the emergency department on Friday August 18th, 2018 for hypertensive urgency, tachycardia, Nausea and Vomiting. Her blood pressure was as high as 190-200's/100-120's. Initial EKG was unremarkable. Labs revealed elevated troponin to 0.09 however, follow-up troponin was less than 0.03. She denied chest pains. She was started on 50 mg Metoprolol BID and told to continue 10 mg Lisinopril Daily. Today she feels much better. She denies a headache and has had no further nausea of vomiting since being discharged from the hospital yesterday. She is sleeping better with the help of Unisom. She continues to complain of left knee pain and has been trying to keep ice on it when resting. She denies injury to her knee but admits to wearing different shoes that could have contributed. Her abdominal pain is better. Her appetite has improved and she is gaining weight. She denies. She has no additional neurologic complaints. She denies any recent fevers or illnesses. She denies any chest pain, shortness of breath, cough, or hemoptysis. She has no nausea, vomiting, constipation, or diarrhea. She has no urinary complaints. Patient otherwise feels well and offers no further specific complaints.  REVIEW OF SYSTEMS:   Review of Systems  Constitutional: Negative for fever, malaise/fatigue and weight loss.  Respiratory: Negative.  Negative for cough, hemoptysis and shortness of breath.   Cardiovascular: Negative.  Negative for chest  pain and leg swelling.  Gastrointestinal: Negative for abdominal pain, blood in stool, melena, nausea and vomiting.  Genitourinary: Negative.  Negative for flank pain.  Musculoskeletal: Negative for joint pain.  Skin: Negative.  Negative for rash.  Neurological: Negative for sensory change and weakness.  Psychiatric/Behavioral: Negative for memory loss. The patient has insomnia. The patient is not nervous/anxious.     As per HPI. Otherwise, a complete review of systems is negative.  PAST MEDICAL HISTORY: Past Medical History:  Diagnosis Date  . Cancer ([place])    lung cancer  . Coronary artery disease    Cardiac catheterization in January of 2015 showed mild nonobstructive three-vessel coronary artery disease with significant mid LAD spasm which improved with nitroglycerin. Metoprolol was switched to diltiazem  . Hyperlipidemia   . Hypertension   . PSVT (paroxysmal supraventricular tachycardia) ([place])     PAST SURGICAL HISTORY: Past Surgical History:  Procedure Laterality Date  . CARDIAC CATHETERIZATION  06/2013   [facility]  . PORTA CATH INSERTION N/A 11/27/2016   Procedure: Glori Luis Cath Insertion;  Surgeon: Katha Cabal, MD;  Location: [place] [facility];  Service: Cardiovascular;  Laterality: N/A;  . SUPRAVENTRICULAR TACHYCARDIA ABLATION N/A 04/22/2014   Procedure: SUPRAVENTRICULAR TACHYCARDIA ABLATION;  Surgeon: Evans Lance, MD;  Location: [facility] CATH LAB;  Service: Cardiovascular;  Laterality: N/A;  . TONSILLECTOMY    . TUBAL LIGATION      FAMILY HISTORY: Family History  Problem Relation Age of Onset  . Stroke Mother   . Hypertension Mother   . Heart attack Father   . Thyroid cancer Sister   . Stroke Sister     ADVANCED DIRECTIVES (Y/N):  N  HEALTH MAINTENANCE: Social  History  Substance Use Topics  . Smoking status: Current Some Day Smoker    Years: 30.00    Types: Cigarettes  . Smokeless tobacco: Never Used  . Alcohol use No     Colonoscopy:  PAP:  Bone  density:  Lipid panel:  Allergies  Allergen Reactions  . Bee Venom Swelling  . Codeine Nausea And Vomiting    Pruritic rash  . Sulfa Antibiotics Other (See Comments)    Other reaction(s): Pruritic rash  . Penicillin G Rash    Has patient had a PCN reaction causing immediate rash, facial/tongue/throat swelling, SOB or lightheadedness with hypotension: Yes Has patient had a PCN reaction causing severe rash involving mucus membranes or skin necrosis: No Has patient had a PCN reaction that required hospitalization No Has patient had a PCN reaction occurring within the last 10 years: No If all of the above answers are "NO", then may proceed with Cephalosporin use.   . Shellfish Allergy Rash    Current Outpatient Prescriptions  Medication Sig Dispense Refill  . ibuprofen (ADVIL,MOTRIN) 200 MG tablet Take 200 mg by mouth every 6 (six) hours as needed.    . lidocaine-prilocaine (EMLA) cream Apply to affected area once 30 g 3  . lisinopril (PRINIVIL,ZESTRIL) 10 MG tablet Take 10 mg by mouth daily.    . metoprolol tartrate (LOPRESSOR) 50 MG tablet Take 1 tablet (50 mg total) by mouth 2 (two) times daily. 60 tablet 3  . omeprazole (PRILOSEC) 20 MG capsule Take 40 mg by mouth daily.    . ondansetron (ZOFRAN) 8 MG tablet Take 1 tablet (8 mg total) by mouth 2 (two) times daily as needed. Start on the third day after cisplatin chemotherapy. 60 tablet 2  . prochlorperazine (COMPAZINE) 10 MG tablet Take 1 tablet (10 mg total) by mouth every 6 (six) hours as needed (Nausea or vomiting). 60 tablet 2   No current facility-administered medications for this visit.    Facility-Administered Medications Ordered in Other Visits  Medication Dose Route Frequency Provider Last Rate Last Dose  . CISplatin (PLATINOL) 138 mg in sodium chloride 0.9 % 500 mL chemo infusion  80 mg/m2 (Treatment Plan Recorded) Intravenous Once Lloyd Huger, MD      . etoposide (VEPESID) 90 mg in sodium chloride 0.9 % 250 mL  chemo infusion  50 mg/m2 (Treatment Plan Recorded) Intravenous Once Lloyd Huger, MD 255 mL/hr at 02/03/17 1225 90 mg at 02/03/17 1225  . heparin lock flush 100 unit/mL  500 Units Intracatheter Once PRN Lloyd Huger, MD      . sodium chloride flush (NS) 0.9 % injection 10 mL  10 mL Intracatheter PRN Lloyd Huger, MD   10 mL at 02/03/17 0829    OBJECTIVE: Vitals:   02/03/17 0835  BP: (!) 142/95  Pulse: 90  Resp: 18  Temp: 98.1 F (36.7 C)     Body mass index is 23.3 kg/m.    ECOG FS:1 - Symptomatic but completely ambulatory  General: Well-developed, well-nourished, no acute distress. Eyes: Pink conjunctiva, anicteric sclera. Lungs: Clear to auscultation bilaterally. Heart: Regular rate and rhythm. No rubs, murmurs, or gallops. Abdomen: Right lower/upper abdomen tenderness. Enlarged liver palpated. Hypoactive bowel sounds. Musculoskeletal: No edema, cyanosis, or clubbing. Neuro: Alert, answering all questions appropriately. Cranial nerves grossly intact. Skin: No rashes or petechiae noted. Psych: Normal affect.   LAB RESULTS:  Lab Results  Component Value Date   NA 134 (L) 02/03/2017   K 3.4 (L) 02/03/2017  CL 102 02/03/2017   CO2 22 02/03/2017   GLUCOSE 106 (H) 02/03/2017   BUN 9 02/03/2017   CREATININE 1.01 (H) 02/03/2017   CALCIUM 9.4 02/03/2017   PROT 6.7 02/03/2017   ALBUMIN 3.3 (L) 02/03/2017   AST 46 (H) 02/03/2017   ALT 16 02/03/2017   ALKPHOS 171 (H) 02/03/2017   BILITOT 0.5 02/03/2017   GFRNONAA 59 (L) 02/03/2017   GFRAA >60 02/03/2017    Lab Results  Component Value Date   WBC 7.6 02/03/2017   NEUTROABS 5.4 02/03/2017   HGB 10.1 (L) 02/03/2017   HCT 29.0 (L) 02/03/2017   MCV 96.7 02/03/2017   PLT 264 02/03/2017     STUDIES: Ct Head Wo Contrast  Result Date: 02/01/2017 CLINICAL DATA:  Hypertension and headache. EXAM: CT HEAD WITHOUT CONTRAST TECHNIQUE: Contiguous axial images were obtained from the base of the skull through  the vertex without intravenous contrast. COMPARISON:  None. FINDINGS: Brain: No evidence of acute infarction, hemorrhage, hydrocephalus, extra-axial collection or mass lesion/mass effect. Vascular: No hyperdense vessel or unexpected calcification. Skull: Normal. Negative for fracture or focal lesion. Sinuses/Orbits: No acute finding. Other: None. IMPRESSION: No acute abnormality. No cause for the patient's symptoms identified. Electronically Signed   By: Dorise Bullion III M.D   On: 02/01/2017 10:46   US Abdomen Complete  Result Date: 01/15/2017 CLINICAL DATA:  Malignant neoplasm of upper lobe of left lung. EXAM: ABDOMEN ULTRASOUND COMPLETE COMPARISON:  PET scan of November 25, 2016.  Ultrasound of Nov 04, 2016. FINDINGS: Gallbladder: No gallstones or wall thickening visualized. No sonographic Murphy sign noted by sonographer. Common bile duct: Diameter: 5.9 mm which is within normal limits. Liver: Multiple rounded lesions are noted in the hepatic parenchyma concerning for metastatic disease. The largest lesion 3.2 cm in diameter. IVC: No abnormality visualized. Pancreas: Not well visualized due to overlying bowel gas. Spleen: Size and appearance within normal limits. Right Kidney: Length: 10.2 cm. Echogenicity within normal limits. No mass or hydronephrosis visualized. Left Kidney: Length: 11.1 cm. 8 mm cyst is seen in upper pole. Echogenicity within normal limits. No mass or hydronephrosis visualized. Abdominal aorta: No aneurysm visualized. Other findings: None. IMPRESSION: Multiple hepatic lesions are noted concerning for metastatic disease. Pancreas is not well visualized due to overlying bowel gas. Electronically Signed   By: Marijo Conception, M.D.   On: 01/15/2017 10:02   Dg Chest Port 1 View  Result Date: 02/01/2017 CLINICAL DATA:  Elevated blood pressure.  Vomiting. EXAM: PORTABLE CHEST 1 VIEW COMPARISON:  July 03, 2013 FINDINGS: A right Port-A-Cath is in good position. No pneumothorax. Minimal  atelectasis in the bases. No nodules or masses. No focal infiltrates. The cardiomediastinal silhouette is unremarkable. IMPRESSION: No active disease. Electronically Signed   By: Dorise Bullion III M.D   On: 02/01/2017 12:39    ASSESSMENT: Stage IV large cell neuroendocrine carcinoma.  PLAN:    1. Stage IV large cell neuroendocrine carcinoma: PET scan results reviewed independently with widespread lesions consistent with metastatic disease. Patient's AFP is normal, but the remainder of her tumor markers are significantly elevated. MRI of the brain is negative for metastatic disease. After lengthy discussion with the patient, she wishes to pursue aggressive treatment with chemotherapy using cisplatin and etoposide. Patient will also receive Neulasta support. Continue with dose reduced etoposide for cycle 4. Will re-image with PET scan on 02/24/17 and see Dr. Grayland Ormond on 02/27/17 for results. Proceed with cycle 4, day 1 of cisplatin and etoposide today. Return to  clinic in 1 and 2 days for etoposide only.  2. Elevated liver enzymes/bilirubin: Likely secondary to progressive disease. Bilirubin normal. AST 46 (much better). Continue to monitor.  3. Hypocalcemia: Resolved. 4. Right sided abdominal pain: Abdominal U/S showed hepatic lesions that were unchanged from previous images.  5. Disposition: Patient's poor prognosis as well as consideration of hospice and end-of-life care were previously discussed previously, but patient wishes to continue with aggressive treatment at this time. 6. Generalized Pain: Resolved.  7. Hypertension: Continue Lisinipril and Metoprolol.  8. Insomnia: Continue Unisom. This seems to be helping.   Patient expressed understanding and was in agreement with this plan. She also understands that She can call clinic at any time with any questions, concerns, or complaints.   Cancer Staging No matching staging information was found for the patient.  Jacquelin Hawking, NP    02/03/2017 1:08 PM

## 2017-02-04 ENCOUNTER — Inpatient Hospital Stay: Payer: 59

## 2017-02-04 VITALS — BP 174/102 | HR 85 | Temp 97.5°F | Resp 18

## 2017-02-04 DIAGNOSIS — C7A8 Other malignant neuroendocrine tumors: Secondary | ICD-10-CM

## 2017-02-04 MED ORDER — SODIUM CHLORIDE 0.9 % IV SOLN
50.0000 mg/m2 | Freq: Once | INTRAVENOUS | Status: AC
  Administered 2017-02-04: 90 mg via INTRAVENOUS
  Filled 2017-02-04: qty 4.5

## 2017-02-04 MED ORDER — DEXAMETHASONE SODIUM PHOSPHATE 10 MG/ML IJ SOLN
10.0000 mg | Freq: Once | INTRAMUSCULAR | Status: AC
  Administered 2017-02-04: 10 mg via INTRAVENOUS
  Filled 2017-02-04: qty 1

## 2017-02-04 MED ORDER — SODIUM CHLORIDE 0.9 % IV SOLN
Freq: Once | INTRAVENOUS | Status: AC
  Administered 2017-02-04: 10:00:00 via INTRAVENOUS
  Filled 2017-02-04: qty 1000

## 2017-02-04 MED ORDER — HEPARIN SOD (PORK) LOCK FLUSH 100 UNIT/ML IV SOLN
500.0000 [IU] | Freq: Once | INTRAVENOUS | Status: AC | PRN
  Administered 2017-02-04: 500 [IU]
  Filled 2017-02-04: qty 5

## 2017-02-04 NOTE — Progress Notes (Signed)
Called Latasha Casa, NP about patient having a BP of 182/101 at arrival. Patient had explained that she had took both of he BP medications 30 minutes prior to coming to her appointment. Ordered to recheck BP at 0945. BP now is 172/105. Latasha [name] , RN with current BP. Currently waiting on Latasha Baller, NP to call back with further orders. Pt is not in any distress, with no complaints of a headache.    02/03/17 1000 Spoke with Latasha [name] and was ordered to go ahead and give Etoposide since patient is not symptomatic. Verified with pharmacy as well. Will recheck BP again in 30 minutes.   02/03/17 1225 Ordered to send patient home, and no medication is needed to be given for hypertension Per Dr. Grayland Ormond. I was told her PCP will be contacted so they can adjust her BP medications. I educated patient on signs of HTN and to recheck her BP later today. She will be back here tomorrow for her chemotherapy treatment so we can follow up then.

## 2017-02-05 ENCOUNTER — Inpatient Hospital Stay: Payer: 59

## 2017-02-05 VITALS — BP 159/90 | HR 78 | Temp 99.5°F | Resp 18

## 2017-02-05 DIAGNOSIS — C7A8 Other malignant neuroendocrine tumors: Secondary | ICD-10-CM

## 2017-02-05 MED ORDER — SODIUM CHLORIDE 0.9 % IV SOLN: 10.0000 mg | Freq: Once | INTRAVENOUS | Status: DC

## 2017-02-05 MED ORDER — SODIUM CHLORIDE 0.9 % IV SOLN
Freq: Once | INTRAVENOUS | Status: AC
  Administered 2017-02-05: 09:00:00 via INTRAVENOUS
  Filled 2017-02-05: qty 1000

## 2017-02-05 MED ORDER — ETOPOSIDE CHEMO INJECTION 1 GM/50ML
50.0000 mg/m2 | Freq: Once | INTRAVENOUS | Status: AC
  Administered 2017-02-05: 90 mg via INTRAVENOUS
  Filled 2017-02-05: qty 4.5

## 2017-02-05 MED ORDER — SODIUM CHLORIDE 0.9% FLUSH
10.0000 mL | INTRAVENOUS | Status: DC | PRN
  Administered 2017-02-05: 10 mL
  Filled 2017-02-05: qty 10

## 2017-02-05 MED ORDER — PEGFILGRASTIM 6 MG/0.6ML ~~LOC~~ PSKT
6.0000 mg | PREFILLED_SYRINGE | Freq: Once | SUBCUTANEOUS | Status: AC
  Administered 2017-02-05: 6 mg via SUBCUTANEOUS
  Filled 2017-02-05: qty 0.6

## 2017-02-05 MED ORDER — HEPARIN SOD (PORK) LOCK FLUSH 100 UNIT/ML IV SOLN
500.0000 [IU] | Freq: Once | INTRAVENOUS | Status: AC | PRN
  Administered 2017-02-05: 500 [IU]
  Filled 2017-02-05: qty 5

## 2017-02-05 MED ORDER — DEXAMETHASONE SODIUM PHOSPHATE 10 MG/ML IJ SOLN
10.0000 mg | Freq: Once | INTRAMUSCULAR | Status: AC
  Administered 2017-02-05: 10 mg via INTRAVENOUS
  Filled 2017-02-05: qty 1

## 2017-02-05 NOTE — Progress Notes (Signed)
N blood return noted when flushing port, attempted 4 saline flushes, no resistance noted, per Tillie Rung, RN ok with Dr Grayland Ormond to proceed.

## 2017-02-14 ENCOUNTER — Telehealth: Payer: Self-pay | Admitting: *Deleted

## 2017-02-14 NOTE — Telephone Encounter (Signed)
Pt called to notify our office that disability forms are being faxed over for [place] to complete in fax back. She just wanted [place] to be aware.

## 2017-02-24 ENCOUNTER — Ambulatory Visit: Payer: 59

## 2017-02-27 ENCOUNTER — Ambulatory Visit: Payer: 59 | Admitting: Oncology

## 2017-02-28 ENCOUNTER — Ambulatory Visit: Payer: 59

## 2017-03-03 ENCOUNTER — Telehealth: Payer: Self-pay | Admitting: *Deleted

## 2017-03-03 ENCOUNTER — Inpatient Hospital Stay: Payer: 59 | Admitting: Oncology

## 2017-03-03 NOTE — Telephone Encounter (Signed)
[name] informed of md recommendations. Agreed to appt with Sonia Baller on Wed 9/19 at 11am with labs.

## 2017-03-03 NOTE — Telephone Encounter (Signed)
PET scan is under peer-to-peer review. If she is that confused, she should go to the emergency room. If not we can see her later this week.

## 2017-03-03 NOTE — Telephone Encounter (Signed)
[name] called in to request for pt to be seen by MD for increased right sided rib pain and confusion. Also states is still waiting to be scheduled for PET scan. Please advise.

## 2017-03-04 ENCOUNTER — Inpatient Hospital Stay: Admission: RE | Admit: 2017-03-04 | Payer: 59 | Source: Ambulatory Visit

## 2017-03-05 ENCOUNTER — Emergency Department: Payer: 59

## 2017-03-05 ENCOUNTER — Ambulatory Visit
Admission: RE | Admit: 2017-03-05 | Discharge: 2017-03-05 | Disposition: A | Discharge status: Home / Self Care | Payer: 59 | Source: Ambulatory Visit | Attending: Oncology | Admitting: Oncology

## 2017-03-05 ENCOUNTER — Inpatient Hospital Stay (HOSPITAL_BASED_OUTPATIENT_CLINIC_OR_DEPARTMENT_OTHER): Payer: 59 | Admitting: Oncology

## 2017-03-05 ENCOUNTER — Inpatient Hospital Stay: Payer: 59

## 2017-03-05 ENCOUNTER — Encounter: Payer: Self-pay | Admitting: *Deleted

## 2017-03-05 ENCOUNTER — Inpatient Hospital Stay: Payer: 59 | Attending: Oncology

## 2017-03-05 ENCOUNTER — Other Ambulatory Visit: Payer: Self-pay

## 2017-03-05 ENCOUNTER — Inpatient Hospital Stay
Admission: EM | Admit: 2017-03-05 | Discharge: 2017-03-07 | DRG: 640 | Disposition: A | Discharge status: Hospice | Payer: 59 | Attending: Internal Medicine | Admitting: Internal Medicine

## 2017-03-05 VITALS — BP 120/84 | HR 137 | Temp 97.1°F | Resp 20 | Wt 139.4 lb

## 2017-03-05 DIAGNOSIS — I471 Supraventricular tachycardia: Secondary | ICD-10-CM | POA: Insufficient documentation

## 2017-03-05 DIAGNOSIS — Z66 Do not resuscitate: Secondary | ICD-10-CM

## 2017-03-05 DIAGNOSIS — C7B8 Other secondary neuroendocrine tumors: Secondary | ICD-10-CM

## 2017-03-05 DIAGNOSIS — D72829 Elevated white blood cell count, unspecified: Secondary | ICD-10-CM | POA: Diagnosis present

## 2017-03-05 DIAGNOSIS — I10 Essential (primary) hypertension: Secondary | ICD-10-CM

## 2017-03-05 DIAGNOSIS — Z9221 Personal history of antineoplastic chemotherapy: Secondary | ICD-10-CM | POA: Insufficient documentation

## 2017-03-05 DIAGNOSIS — E871 Hypo-osmolality and hyponatremia: Secondary | ICD-10-CM | POA: Diagnosis present

## 2017-03-05 DIAGNOSIS — Z885 Allergy status to narcotic agent status: Secondary | ICD-10-CM | POA: Diagnosis not present

## 2017-03-05 DIAGNOSIS — E785 Hyperlipidemia, unspecified: Secondary | ICD-10-CM | POA: Insufficient documentation

## 2017-03-05 DIAGNOSIS — C7A8 Other malignant neuroendocrine tumors: Secondary | ICD-10-CM | POA: Insufficient documentation

## 2017-03-05 DIAGNOSIS — R748 Abnormal levels of other serum enzymes: Secondary | ICD-10-CM

## 2017-03-05 DIAGNOSIS — R2681 Unsteadiness on feet: Secondary | ICD-10-CM | POA: Diagnosis not present

## 2017-03-05 DIAGNOSIS — Z823 Family history of stroke: Secondary | ICD-10-CM | POA: Diagnosis not present

## 2017-03-05 DIAGNOSIS — Z882 Allergy status to sulfonamides status: Secondary | ICD-10-CM

## 2017-03-05 DIAGNOSIS — Z880 Allergy status to penicillin: Secondary | ICD-10-CM | POA: Diagnosis not present

## 2017-03-05 DIAGNOSIS — R5383 Other fatigue: Secondary | ICD-10-CM

## 2017-03-05 DIAGNOSIS — Z91013 Allergy to seafood: Secondary | ICD-10-CM | POA: Diagnosis not present

## 2017-03-05 DIAGNOSIS — R413 Other amnesia: Secondary | ICD-10-CM | POA: Insufficient documentation

## 2017-03-05 DIAGNOSIS — R110 Nausea: Secondary | ICD-10-CM | POA: Insufficient documentation

## 2017-03-05 DIAGNOSIS — R109 Unspecified abdominal pain: Secondary | ICD-10-CM

## 2017-03-05 DIAGNOSIS — I2510 Atherosclerotic heart disease of native coronary artery without angina pectoris: Secondary | ICD-10-CM | POA: Diagnosis present

## 2017-03-05 DIAGNOSIS — C787 Secondary malignant neoplasm of liver and intrahepatic bile duct: Secondary | ICD-10-CM

## 2017-03-05 DIAGNOSIS — Z79899 Other long term (current) drug therapy: Secondary | ICD-10-CM

## 2017-03-05 DIAGNOSIS — R180 Malignant ascites: Secondary | ICD-10-CM | POA: Insufficient documentation

## 2017-03-05 DIAGNOSIS — R630 Anorexia: Secondary | ICD-10-CM | POA: Diagnosis not present

## 2017-03-05 DIAGNOSIS — F17210 Nicotine dependence, cigarettes, uncomplicated: Secondary | ICD-10-CM | POA: Diagnosis present

## 2017-03-05 DIAGNOSIS — Z91030 Bee allergy status: Secondary | ICD-10-CM

## 2017-03-05 DIAGNOSIS — C799 Secondary malignant neoplasm of unspecified site: Secondary | ICD-10-CM | POA: Diagnosis not present

## 2017-03-05 DIAGNOSIS — C7B03 Secondary carcinoid tumors of bone: Secondary | ICD-10-CM | POA: Diagnosis present

## 2017-03-05 DIAGNOSIS — Z515 Encounter for palliative care: Secondary | ICD-10-CM | POA: Diagnosis present

## 2017-03-05 DIAGNOSIS — G9341 Metabolic encephalopathy: Secondary | ICD-10-CM | POA: Diagnosis present

## 2017-03-05 DIAGNOSIS — R4182 Altered mental status, unspecified: Secondary | ICD-10-CM

## 2017-03-05 DIAGNOSIS — R410 Disorientation, unspecified: Secondary | ICD-10-CM | POA: Insufficient documentation

## 2017-03-05 DIAGNOSIS — I700 Atherosclerosis of aorta: Secondary | ICD-10-CM

## 2017-03-05 DIAGNOSIS — R42 Dizziness and giddiness: Secondary | ICD-10-CM | POA: Diagnosis not present

## 2017-03-05 DIAGNOSIS — R05 Cough: Secondary | ICD-10-CM | POA: Insufficient documentation

## 2017-03-05 DIAGNOSIS — C7951 Secondary malignant neoplasm of bone: Secondary | ICD-10-CM | POA: Insufficient documentation

## 2017-03-05 DIAGNOSIS — E860 Dehydration: Secondary | ICD-10-CM | POA: Diagnosis present

## 2017-03-05 DIAGNOSIS — C7A090 Malignant carcinoid tumor of the bronchus and lung: Secondary | ICD-10-CM | POA: Diagnosis present

## 2017-03-05 DIAGNOSIS — Z808 Family history of malignant neoplasm of other organs or systems: Secondary | ICD-10-CM

## 2017-03-05 DIAGNOSIS — K769 Liver disease, unspecified: Secondary | ICD-10-CM

## 2017-03-05 DIAGNOSIS — Z8249 Family history of ischemic heart disease and other diseases of the circulatory system: Secondary | ICD-10-CM | POA: Diagnosis not present

## 2017-03-05 DIAGNOSIS — C7B02 Secondary carcinoid tumors of liver: Secondary | ICD-10-CM | POA: Diagnosis present

## 2017-03-05 DIAGNOSIS — R140 Abdominal distension (gaseous): Secondary | ICD-10-CM

## 2017-03-05 DIAGNOSIS — R531 Weakness: Secondary | ICD-10-CM | POA: Insufficient documentation

## 2017-03-05 LAB — AMMONIA: AMMONIA: 48 umol/L — AB (ref 9–35)

## 2017-03-05 LAB — COMPREHENSIVE METABOLIC PANEL
ALT: 73 U/L — AB (ref 14–54)
AST: 137 U/L — AB (ref 15–41)
Albumin: 3.2 g/dL — ABNORMAL LOW (ref 3.5–5.0)
Alkaline Phosphatase: 397 U/L — ABNORMAL HIGH (ref 38–126)
Anion gap: 14 (ref 5–15)
BILIRUBIN TOTAL: 3.4 mg/dL — AB (ref 0.3–1.2)
BUN: 13 mg/dL (ref 6–20)
CO2: 20 mmol/L — ABNORMAL LOW (ref 22–32)
CREATININE: 1.1 mg/dL — AB (ref 0.44–1.00)
Calcium: 12.8 mg/dL — ABNORMAL HIGH (ref 8.9–10.3)
Chloride: 94 mmol/L — ABNORMAL LOW (ref 101–111)
GFR calc Af Amer: >60 mL/min (ref >60)
GFR, EST NON AFRICAN AMERICAN: 53 mL/min — AB (ref >60)
Glucose, Bld: 144 mg/dL — ABNORMAL HIGH (ref 65–99)
POTASSIUM: 3.6 mmol/L (ref 3.5–5.1)
Sodium: 128 mmol/L — ABNORMAL LOW (ref 135–145)
Total Protein: 6.6 g/dL (ref 6.5–8.1)

## 2017-03-05 LAB — URINALYSIS, COMPLETE (UACMP) WITH MICROSCOPIC
BACTERIA UA: NONE SEEN
BILIRUBIN URINE: NEGATIVE
Glucose, UA: NEGATIVE mg/dL
HGB URINE DIPSTICK: NEGATIVE
Ketones, ur: NEGATIVE mg/dL
LEUKOCYTES UA: NEGATIVE
NITRITE: NEGATIVE
PH: 5 (ref 5.0–8.0)
Protein, ur: 30 mg/dL — AB
SPECIFIC GRAVITY, URINE: 1.016 (ref 1.005–1.030)
Squamous Epithelial / LPF: NONE SEEN

## 2017-03-05 LAB — CBC WITH DIFFERENTIAL/PLATELET
BASOS ABS: 0.1 10*3/uL (ref 0–0.1)
Basophils Relative: 1 %
EOS PCT: 1 %
Eosinophils Absolute: 0.1 10*3/uL (ref 0–0.7)
HEMATOCRIT: 36.5 % (ref 35.0–47.0)
Hemoglobin: 12.6 g/dL (ref 12.0–16.0)
LYMPHS ABS: 1.6 10*3/uL (ref 1.0–3.6)
LYMPHS PCT: 12 %
MCH: 35.5 pg — AB (ref 26.0–34.0)
MCHC: 34.6 g/dL (ref 32.0–36.0)
MCV: 102.7 fL — AB (ref 80.0–100.0)
MONO ABS: 1.9 10*3/uL — AB (ref 0.2–0.9)
Monocytes Relative: 14 %
NEUTROS ABS: 10.1 10*3/uL — AB (ref 1.4–6.5)
Neutrophils Relative %: 74 %
Platelets: 149 10*3/uL — ABNORMAL LOW (ref 150–440)
RBC: 3.56 MIL/uL — ABNORMAL LOW (ref 3.80–5.20)
RDW: 16.5 % — AB (ref 11.5–14.5)
WBC: 13.7 10*3/uL — ABNORMAL HIGH (ref 3.6–11.0)

## 2017-03-05 MED ORDER — PANTOPRAZOLE SODIUM 40 MG PO TBEC
40.0000 mg | DELAYED_RELEASE_TABLET | Freq: Every day | ORAL | Status: DC
  Administered 2017-03-06 – 2017-03-07 (×2): 40 mg via ORAL
  Filled 2017-03-05 (×3): qty 1

## 2017-03-05 MED ORDER — ACETAMINOPHEN 650 MG RE SUPP: 650.0000 mg | Freq: Four times a day (QID) | RECTAL | Status: DC | PRN

## 2017-03-05 MED ORDER — IOPAMIDOL (ISOVUE-300) INJECTION 61%
100.0000 mL | Freq: Once | INTRAVENOUS | Status: AC | PRN
  Administered 2017-03-05: 100 mL via INTRAVENOUS

## 2017-03-05 MED ORDER — SODIUM CHLORIDE 0.9 % IV BOLUS (SEPSIS)
1000.0000 mL | Freq: Once | INTRAVENOUS | Status: AC
  Administered 2017-03-05: 1000 mL via INTRAVENOUS

## 2017-03-05 MED ORDER — SODIUM CHLORIDE 0.9 % IV SOLN
INTRAVENOUS | Status: DC
  Administered 2017-03-05 – 2017-03-07 (×5): via INTRAVENOUS

## 2017-03-05 MED ORDER — DOCUSATE SODIUM 100 MG PO CAPS
100.0000 mg | ORAL_CAPSULE | Freq: Two times a day (BID) | ORAL | Status: DC
  Administered 2017-03-05 – 2017-03-07 (×3): 100 mg via ORAL
  Filled 2017-03-05 (×4): qty 1

## 2017-03-05 MED ORDER — PROCHLORPERAZINE MALEATE 10 MG PO TABS
10.0000 mg | ORAL_TABLET | Freq: Four times a day (QID) | ORAL | Status: DC | PRN
  Filled 2017-03-05: qty 1

## 2017-03-05 MED ORDER — SODIUM CHLORIDE 0.9 % IV SOLN
Freq: Once | INTRAVENOUS | Status: AC
  Administered 2017-03-05: 12:00:00 via INTRAVENOUS
  Filled 2017-03-05: qty 4

## 2017-03-05 MED ORDER — SODIUM CHLORIDE 0.9 % IV SOLN
Freq: Once | INTRAVENOUS | Status: AC
  Administered 2017-03-05: 12:00:00 via INTRAVENOUS
  Filled 2017-03-05: qty 1000

## 2017-03-05 MED ORDER — ALBUTEROL SULFATE (2.5 MG/3ML) 0.083% IN NEBU: 2.5000 mg | INHALATION_SOLUTION | RESPIRATORY_TRACT | Status: DC | PRN

## 2017-03-05 MED ORDER — METOPROLOL TARTRATE 50 MG PO TABS
50.0000 mg | ORAL_TABLET | Freq: Two times a day (BID) | ORAL | Status: DC
  Administered 2017-03-05 – 2017-03-07 (×4): 50 mg via ORAL
  Filled 2017-03-05 (×4): qty 1

## 2017-03-05 MED ORDER — LISINOPRIL 10 MG PO TABS
10.0000 mg | ORAL_TABLET | Freq: Every day | ORAL | Status: DC
  Administered 2017-03-06 – 2017-03-07 (×2): 10 mg via ORAL
  Filled 2017-03-05 (×2): qty 1

## 2017-03-05 MED ORDER — ONDANSETRON HCL 4 MG/2ML IJ SOLN: 4.0000 mg | Freq: Four times a day (QID) | INTRAMUSCULAR | Status: DC | PRN

## 2017-03-05 MED ORDER — SENNOSIDES-DOCUSATE SODIUM 8.6-50 MG PO TABS: 1.0000 | ORAL_TABLET | Freq: Every evening | ORAL | Status: DC | PRN

## 2017-03-05 MED ORDER — BISACODYL 5 MG PO TBEC: 5.0000 mg | DELAYED_RELEASE_TABLET | Freq: Every day | ORAL | Status: DC | PRN

## 2017-03-05 MED ORDER — ACETAMINOPHEN 325 MG PO TABS
650.0000 mg | ORAL_TABLET | Freq: Four times a day (QID) | ORAL | Status: DC | PRN
  Administered 2017-03-06 – 2017-03-07 (×2): 650 mg via ORAL
  Filled 2017-03-05 (×2): qty 2

## 2017-03-05 MED ORDER — ONDANSETRON HCL 4 MG PO TABS: 4.0000 mg | ORAL_TABLET | Freq: Four times a day (QID) | ORAL | Status: DC | PRN

## 2017-03-05 MED ORDER — ENOXAPARIN SODIUM 40 MG/0.4ML ~~LOC~~ SOLN
40.0000 mg | SUBCUTANEOUS | Status: DC
  Administered 2017-03-05 – 2017-03-06 (×2): 40 mg via SUBCUTANEOUS
  Filled 2017-03-05 (×2): qty 0.4

## 2017-03-05 NOTE — ED Notes (Signed)
Nurse attempted to call report. Nurse was told room was being cleaned at this time.

## 2017-03-05 NOTE — ED Notes (Signed)
Dr. Mable Paris notified that family would like to speak with him about the types of DNRs and form available for end-of-life.

## 2017-03-05 NOTE — ED Provider Notes (Signed)
[facility] Emergency Department Provider Note  ____________________________________________   First MD Initiated Contact with Patient 03/05/17 1705     (approximate)  I have reviewed the triage vital signs and the nursing notes.   HISTORY  Chief Complaint Dizziness  level V exemption history Limited by the patient's altered mental status  HPI Latasha [name] is a 61 y.o. female who sent to the emergency department from the cancer center for abnormal labs. She has a history of stage IV neuroendocrine tumor which is not felt to be curable. She was at a routine follow-up appointment today when family noted that the patient was more confused than usual and she had blood work and a CT scan done. There were multiple abnormalities and she was advised to come to the emergency department. The patient has never had a CODE STATUS discussion with family. Further history is challenging to obtain secondary to altered mental status.The nurse practitioner at the cancer center also noted that the patient had some fluid in her abdomen and she was concerned that the patient might require a paracentesis   Past Medical History:  Diagnosis Date  . Cancer ([place])    lung cancer  . Coronary artery disease    Cardiac catheterization in January of 2015 showed mild nonobstructive three-vessel coronary artery disease with significant mid LAD spasm which improved with nitroglycerin. Metoprolol was switched to diltiazem  . Hyperlipidemia   . Hypertension   . PSVT (paroxysmal supraventricular tachycardia) [facility])     Patient Active Problem List   Diagnosis Date Noted  . Hypercalcemia 03/05/2017  . Nausea and vomiting 02/02/2017  . Malignant essential hypertension 02/02/2017  . Hypertensive encephalopathy 02/02/2017  . Elevated transaminase level 02/02/2017  . Anemia 02/02/2017  . Sinus tachycardia 02/02/2017  . Demand ischemia ([place]) 02/01/2017  . Large cell neuroendocrine carcinoma  ([place]) 11/16/2016  . Malignant neoplasm of right upper lobe of lung ([place]) 11/07/2016  . Malignant neoplasm of upper lobe of left lung ([place]) 11/07/2016  . Rib pain on right side 11/07/2016  . Mass of left lung 11/05/2016  . Mass of multiple sites of liver 11/05/2016  . Chronic non-seasonal allergic rhinitis 04/11/2016  . SVT (supraventricular tachycardia) ([place]) 04/22/2014  . PSVT (paroxysmal supraventricular tachycardia) ([place])   . Coronary atherosclerosis   . Hyperlipidemia, unspecified   . Family history of cardiac arrest 12/28/2012  . Knee pain, chronic 12/28/2012  . Non-compliance with treatment 12/28/2012  . Palpitations 12/28/2012  . Tests ordered 12/25/2012    Past Surgical History:  Procedure Laterality Date  . CARDIAC CATHETERIZATION  06/2013   [facility]  . PORTA CATH INSERTION N/A 11/27/2016   Procedure: Glori Luis Cath Insertion;  Surgeon: Katha Cabal, MD;  Location: [place] [facility];  Service: Cardiovascular;  Laterality: N/A;  . SUPRAVENTRICULAR TACHYCARDIA ABLATION N/A 04/22/2014   Procedure: SUPRAVENTRICULAR TACHYCARDIA ABLATION;  Surgeon: Evans Lance, MD;  Location: [facility] CATH LAB;  Service: Cardiovascular;  Laterality: N/A;  . TONSILLECTOMY    . TUBAL LIGATION      Prior to Admission medications   Medication Sig Start Date End Date Taking? Authorizing Provider  ibuprofen (ADVIL,MOTRIN) 200 MG tablet Take 200 mg by mouth every 6 (six) hours as needed.   Yes [provider]  lidocaine-prilocaine (EMLA) cream Apply to affected area once 11/27/16  Yes Finnegan, Kathlene November, MD  lisinopril (PRINIVIL,ZESTRIL) 10 MG tablet Take 10 mg by mouth daily.   Yes [provider]  metoprolol tartrate (LOPRESSOR) 50  MG tablet Take 1 tablet (50 mg total) by mouth 2 (two) times daily. 02/02/17  Yes Theodoro Grist, MD  omeprazole (PRILOSEC) 20 MG capsule Take 40 mg by mouth daily.   Yes [provider]  ondansetron (ZOFRAN) 8 MG tablet Take 1 tablet (8 mg total) by  mouth 2 (two) times daily as needed. Start on the third day after cisplatin chemotherapy. 12/16/16  Yes Lloyd Huger, MD  prochlorperazine (COMPAZINE) 10 MG tablet Take 1 tablet (10 mg total) by mouth every 6 (six) hours as needed (Nausea or vomiting). 12/16/16  Yes Lloyd Huger, MD    Allergies Bee venom; Codeine; Sulfa antibiotics; Penicillin g; and Shellfish allergy  Family History  Problem Relation Age of Onset  . Stroke Mother   . Hypertension Mother   . Heart attack Father   . Thyroid cancer Sister   . Stroke Sister     Social History Social History  Substance Use Topics  . Smoking status: Current Some Day Smoker    Years: 30.00    Types: Cigarettes  . Smokeless tobacco: Never Used  . Alcohol use No    Review of Systems level V exemption history Limited by the patient's clinical condition  ____________________________________________   PHYSICAL EXAM:  VITAL SIGNS: ED Triage Vitals  Enc Vitals Group     BP 03/05/17 1705 (!) 158/97     Pulse Rate 03/05/17 1705 (!) 117     Resp 03/05/17 1705 18     Temp 03/05/17 1705 (!) 97.5 F (36.4 C)     Temp Source 03/05/17 1705 Oral     SpO2 03/05/17 1705 97 %     Weight --      Height --      Head Circumference --      Peak Flow --      Pain Score 03/05/17 1704 9     Pain Loc --      Pain Edu? --      Excl. in [place]? --     Constitutional: confused with disorganized thought process. Alert and oriented to name and place as well as year. Appears uncomfortable Eyes: PERRL EOMI. pupils midrange and brisk Head: Atraumatic. Nose: No congestion/rhinnorhea. Mouth/Throat: No trismus Neck: No stridor.   Cardiovascular: tachycardic rate, regular rhythm. Grossly normal heart sounds.  Good peripheral circulation. port accessed on the right chest Respiratory: Normal respiratory effort.  No retractions. Lungs CTAB and moving good air Gastrointestinal: soft nondistended nontender no rebound no guarding no  peritonitis Musculoskeletal: No lower extremity edema   Neurologic:  . No gross focal neurologic deficits are appreciated. Skin:  Skin is warm, dry and intact. No rash noted. Psychiatric: appears somewhat agitated clearly confused   ____________________________________________   DIFFERENTIAL includes but not limited to  metabolic derangement, sepsis, brain metastasis, progression of metastatic disease ____________________________________________   LABS (all labs ordered are listed, but only abnormal results are displayed)  Labs Reviewed  BASIC METABOLIC PANEL  CBC    blood work reviewed by me shows markedly elevated calcium concerning for the etiology of her altered mental status. Hyponatremia with hypochloremia likewise concerning for dehydration __________________________________________  EKG   ______________________________________  RADIOLOGY  outpatient CT scan reviewed by me shows innumerable liver and bony metastases. Likewise shows some mild free fluid ____________________________________________   PROCEDURES  Procedure(s) performed: no  Procedures  Critical Care performed: yes  CRITICAL CARE Performed by: Darel Hong   Total critical care time: 35 minutes  Critical care time  was exclusive of separately billable procedures and treating other patients.  Critical care was necessary to treat or prevent imminent or life-threatening deterioration.  Critical care was time spent personally by me on the following activities: development of treatment plan with patient and/or surrogate as well as nursing, discussions with consultants, evaluation of patient's response to treatment, examination of patient, obtaining history from patient or surrogate, ordering and performing treatments and interventions, ordering and review of laboratory studies, ordering and review of radiographic studies, pulse oximetry and re-evaluation of patient's condition.   Observation:  no ____________________________________________   INITIAL IMPRESSION / ASSESSMENT AND PLAN / ED COURSE  Pertinent labs & imaging results that were available during my care of the patient were reviewed by me and considered in my medical decision making (see chart for details).       ----------------------------------------- 5:23 PM on 03/05/2017 -----------------------------------------  The patient arrives tachycardic with numerous lab abnormalities most notably an elevated calcium to 12.8, hyponatremia, hypochloremia. She also has new altered mental status. A CT scan of the abdomen pelvis today does show markedly progression of the patient's disease. I appreciate that she was sent here for possible paracentesis, however she has a trace amount of free fluid that would not be amenable to tap. Regardless given her altered mental status, vital sign abnormalities, and profound hypercalcemia she requires inpatient admission for IV fluids and likely bisphosphonates. I have initiated an end-of-life discussion with the patient and her family as they have never discussed CODE STATUS before.` ____________________________________________  fortunately the CT scan of the patient's head is negative for acute disease. Regardless she requires inpatient admission for continuous IV fluids and treatment of her hypercalcemia and multiple metabolic derangements.  FINAL CLINICAL IMPRESSION(S) / ED DIAGNOSES  Final diagnoses:  Dehydration  Hypercalcemia  Fatigue, unspecified type  Altered mental status, unspecified altered mental status type  Hyponatremia      NEW MEDICATIONS STARTED DURING THIS VISIT:  Current Discharge Medication List       Note:  This document was prepared using Dragon voice recognition software and may include unintentional dictation errors.     Darel Hong, MD 03/06/17 [phone]

## 2017-03-05 NOTE — Progress Notes (Signed)
Advanced Care Plan.  Purpose of Encounter: CODE STATUS.  Parties in Attendance: The patient, her husband and 2 daughters, me. Patient's Decisional Capacity: Yes. Medical Story: Latasha [name]  is a 61 y.o. 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 She was found confused and high blood culture and level at 12.8 today. CAT scan of the abdomen show worsening liver metastasis. I discussed with the patient and her husband and daughters about the patient's current condition and poor prognosis. I also discussed about her CODE STATUS. The patient said she could not decide the CODE STATUS now. She will discuss with family members then decide. Plan: Palliative care consult. Code Status: Full code for now. Time spent discussing advance care planning: 17 minutes.

## 2017-03-05 NOTE — ED Notes (Signed)
Nurse went to check on pt. Pt is currently eating and in NAD. Family is at bedside.

## 2017-03-05 NOTE — ED Notes (Signed)
Patient transported to CT and X ray 

## 2017-03-05 NOTE — H&P (Addendum)
[facility] [place] NAME: Latasha [name]    MR#:  [id number]  DATE OF BIRTH:  01/16/1956  DATE OF ADMISSION:  03/05/2017  PRIMARY CARE PHYSICIAN: Barbaraann Boys, MD   REQUESTING/REFERRING PHYSICIAN: Darel Hong, MD  CHIEF COMPLAINT:   Chief Complaint  Patient presents with  . Dizziness   Generalized weakness for 2 weeks, confusion and hypercalcemia today. HISTORY OF PRESENT ILLNESS:  Latasha [name]  is a 61 y.o. 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 The patient was sent to ED due to above chief complaints. The patient has had generalized weakness for the past 2 weeks. She was found confused and high blood calcium level at 12.8 today. She also complains of dizziness, poor oral intake and abdominal pain, which is in the right and middle part of the abdomen, intermittent, 8 out of 10 without radiation.  PAST MEDICAL HISTORY:   Past Medical History:  Diagnosis Date  . Cancer ([place])    lung cancer  . Coronary artery disease    Cardiac catheterization in January of 2015 showed mild nonobstructive three-vessel coronary artery disease with significant mid LAD spasm which improved with nitroglycerin. Metoprolol was switched to diltiazem  . Hyperlipidemia   . Hypertension   . PSVT (paroxysmal supraventricular tachycardia) ([place])     PAST SURGICAL HISTORY:   Past Surgical History:  Procedure Laterality Date  . CARDIAC CATHETERIZATION  06/2013   [facility]  . PORTA CATH INSERTION N/A 11/27/2016   Procedure: Glori Luis Cath Insertion;  Surgeon: Katha Cabal, MD;  Location: [facility];  Service: Cardiovascular;  Laterality: N/A;  . SUPRAVENTRICULAR TACHYCARDIA ABLATION N/A 04/22/2014   Procedure: SUPRAVENTRICULAR TACHYCARDIA ABLATION;  Surgeon: Evans Lance, MD;  Location: [facility] CATH LAB;  Service: Cardiovascular;  Laterality: N/A;  .  TONSILLECTOMY    . TUBAL LIGATION      SOCIAL HISTORY:   Social History  Substance Use Topics  . Smoking status: Current Some Day Smoker    Years: 30.00    Types: Cigarettes  . Smokeless tobacco: Never Used  . Alcohol use No    FAMILY HISTORY:   Family History  Problem Relation Age of Onset  . Stroke Mother   . Hypertension Mother   . Heart attack Father   . Thyroid cancer Sister   . Stroke Sister     DRUG ALLERGIES:   Allergies  Allergen Reactions  . Bee Venom Swelling  . Codeine Nausea And Vomiting    Pruritic rash  . Sulfa Antibiotics Other (See Comments)    Other reaction(s): Pruritic rash  . Penicillin G Rash    Has patient had a PCN reaction causing immediate rash, facial/tongue/throat swelling, SOB or lightheadedness with hypotension: Yes Has patient had a PCN reaction causing severe rash involving mucus membranes or skin necrosis: No Has patient had a PCN reaction that required hospitalization No Has patient had a PCN reaction occurring within the last 10 years: No If all of the above answers are "NO", then may proceed with Cephalosporin use.   . Shellfish Allergy Rash    REVIEW OF SYSTEMS:   Review of Systems  Constitutional: Positive for malaise/fatigue and weight loss. Negative for chills and fever.  HENT: Negative for sore throat.   Eyes: Negative for blurred vision and double vision.  Respiratory: Negative for cough, hemoptysis, shortness of breath, wheezing and stridor.  Cardiovascular: Negative for chest pain, palpitations, orthopnea and leg swelling.  Gastrointestinal: Positive for abdominal pain and nausea. Negative for blood in stool, diarrhea, melena and vomiting.  Genitourinary: Negative for dysuria, flank pain and hematuria.  Musculoskeletal: Negative for back pain and joint pain.  Skin: Negative for rash.  Neurological: Positive for weakness. Negative for dizziness, sensory change, focal weakness, seizures, loss of consciousness and  headaches.  Endo/Heme/Allergies: Negative for polydipsia.  Psychiatric/Behavioral: Negative for depression. The patient is not nervous/anxious.     MEDICATIONS AT HOME:   Prior to Admission medications   Medication Sig Start Date End Date Taking? Authorizing Provider  ibuprofen (ADVIL,MOTRIN) 200 MG tablet Take 200 mg by mouth every 6 (six) hours as needed.   Yes [provider]  lidocaine-prilocaine (EMLA) cream Apply to affected area once 11/27/16  Yes Finnegan, Kathlene November, MD  lisinopril (PRINIVIL,ZESTRIL) 10 MG tablet Take 10 mg by mouth daily.   Yes [provider]  metoprolol tartrate (LOPRESSOR) 50 MG tablet Take 1 tablet (50 mg total) by mouth 2 (two) times daily. 02/02/17  Yes Theodoro Grist, MD  omeprazole (PRILOSEC) 20 MG capsule Take 40 mg by mouth daily.   Yes [provider]  ondansetron (ZOFRAN) 8 MG tablet Take 1 tablet (8 mg total) by mouth 2 (two) times daily as needed. Start on the third day after cisplatin chemotherapy. 12/16/16  Yes Lloyd Huger, MD  prochlorperazine (COMPAZINE) 10 MG tablet Take 1 tablet (10 mg total) by mouth every 6 (six) hours as needed (Nausea or vomiting). 12/16/16  Yes Lloyd Huger, MD      VITAL SIGNS:  Blood pressure (!) 166/109, pulse (!) 110, temperature (!) 97.5 F (36.4 C), temperature source Oral, resp. rate (!) 27, height 5\' 5"  (1.651 m), weight 139 lb (63 kg), SpO2 97 %.  PHYSICAL EXAMINATION:  Physical Exam  GENERAL:  61 y.o.-year-old patient lying in the bed with no acute distress.  EYES: Pupils equal, round, reactive to light and accommodation. has scleral icterus. Extraocular muscles intact.  HEENT: Head atraumatic, normocephalic. Oropharynx and nasopharynx clear.  NECK:  Supple, no jugular venous distention. No thyroid enlargement, no tenderness.  LUNGS: Normal breath sounds bilaterally, no wheezing, rales,rhonchi or crepitation. No use of accessory muscles of respiration.  CARDIOVASCULAR: S1,  S2 normal. No murmurs, rubs, or gallops.  ABDOMEN: Soft, tenderness in RUQ and middle part and distended. Bowel sounds present. Positive marked hepatomegaly.  EXTREMITIES: No pedal edema, cyanosis, or clubbing.  NEUROLOGIC: Cranial nerves II through XII are intact. Muscle strength 4/5 in all extremities. Sensation intact. Gait not checked.  PSYCHIATRIC: The patient is alert and oriented x 3.  SKIN: No obvious rash, lesion, or ulcer. But has jaundice.  LABORATORY PANEL:   CBC  Recent Labs Lab 03/05/17 1035  WBC 13.7*  HGB 12.6  HCT 36.5  PLT 149*   ------------------------------------------------------------------------------------------------------------------  Chemistries   Recent Labs Lab 03/05/17 1035  NA 128*  K 3.6  CL 94*  CO2 20*  GLUCOSE 144*  BUN 13  CREATININE 1.10*  CALCIUM 12.8*  AST 137*  ALT 73*  ALKPHOS 397*  BILITOT 3.4*   ------------------------------------------------------------------------------------------------------------------  Cardiac Enzymes No results for input(s): TROPONINI in the last 168 hours. ------------------------------------------------------------------------------------------------------------------  RADIOLOGY:  Dg Chest 1 View  Result Date: 03/05/2017 CLINICAL DATA:  Dizziness, weakness EXAM: CHEST 1 VIEW COMPARISON:  02/01/2017 FINDINGS: Mild linear scarring in the right mid lung. Lungs are otherwise clear. No pleural effusion or pneumothorax. The heart is  normal in size. Right IJ chest port terminates at the cavoatrial junction. IMPRESSION: No evidence of acute cardiopulmonary disease. Electronically Signed   By: Julian Hy M.D.   On: 03/05/2017 18:09   Ct Head Wo Contrast  Result Date: 03/05/2017 CLINICAL DATA:  Dizziness and weakness over the past 2 weeks. Stage 4 large cell neuroendocrine carcinoma. Recent administration of intravenous contrast for abdominal CT. EXAM: CT HEAD WITHOUT CONTRAST TECHNIQUE: Contiguous  axial images were obtained from the base of the skull through the vertex without intravenous contrast. COMPARISON:  02/01/2017 FINDINGS: Brain: No evidence for acute hemorrhage, mass lesion, midline shift, hydrocephalus or large infarct. Vascular: Vascular structures are slightly bright and probably related to recent contrast administration. Skull: Normal. Negative for fracture or focal lesion. Sinuses/Orbits: No acute finding. Other: None. IMPRESSION: No acute intracranial abnormality. Electronically Signed   By: Markus Daft M.D.   On: 03/05/2017 17:47   Ct Abdomen Pelvis W Contrast  Result Date: 03/05/2017 CLINICAL DATA:  Metastatic large cell neuroendocrine carcinoma. Severe right mid abdominal pain. EXAM: CT ABDOMEN AND PELVIS WITH CONTRAST TECHNIQUE: Multidetector CT imaging of the abdomen and pelvis was performed using the standard protocol following bolus administration of intravenous contrast. CONTRAST:  147mL ISOVUE-300 IOPAMIDOL (ISOVUE-300) INJECTION 61% COMPARISON:  PET-CT 11/25/2016.  Abdomen and pelvis CT 11/07/2016 FINDINGS: Lower chest: Lingular nodule measures 1.3 cm on today's exam not substantially changed since PET-CT when it was measured at 1.5 cm right middle lobe irregular nodule has been incompletely visualized. Hepatobiliary: Innumerable metastatic lesions are identified in the liver. This is difficult to directly compare to the prior PET-CT performed without intravenous contrast material. Imaging features appear relatively stable when comparing to 11/07/2016 exam. A discrete lesion in the lateral segment left liver along the falciform ligament is 1.9 cm in diameter today which compares to 1.9 cm when I remeasure the same lesion on the 11/07/2016 exam. Gallbladder is nondistended with a suggestion of gallbladder wall thickening. No intrahepatic or extrahepatic biliary dilation. Pancreas: Pancreatic parenchymal calcifications suggest chronic pancreatitis without peripancreatic edema or  inflammation on today's study. No dilatation of the main pancreatic duct. Spleen: Unremarkable Adrenals/Urinary Tract: No adrenal nodule or mass. Kidneys are unremarkable. No evidence for hydroureter. The urinary bladder appears normal for the degree of distention. Stomach/Bowel: Stomach is nondistended. No gastric wall thickening. No evidence of outlet obstruction. Duodenum is normally positioned as is the ligament of Treitz. Multiple distal duodenal diverticuli are evident. No evidence of small-bowel obstruction. The terminal ileum is normal. The appendix is normal. Oral contrast material has migrated through to the mid colon. Vascular/Lymphatic: There is abdominal aortic atherosclerosis without aneurysm. Portal vein and superior mesenteric vein are patent. Splenic vein is patent. Mass-effect noted on the intrahepatic hepatic veins due to metastatic disease. The celiac axis and SMA opacified. Similar appearance mild hepatoduodenal ligament lymphadenopathy. Small left para-aortic lymph nodes are similar to prior. No pelvic sidewall lymphadenopathy. Reproductive: The uterus has normal CT imaging appearance. There is no adnexal mass. Other: Small volume free fluid anterior to the liver is new in the interval. Trace free fluid is noted in the cul-de-sac. Musculoskeletal: Diffuse sclerotic bone lesions have clearly progressed since the PET-CT from 11/25/2016. IMPRESSION: 1. Interval development of a small amount of fluid anterior to the liver with trace free fluid noted in the cul-de-sac. 2. Innumerable hepatic metastases, similar in appearance to prior imaging studies. Small lymph nodes in the hepatoduodenal ligament are stable. 3. Interval progression of innumerable bony metastases since 11/25/2016. 4.  No evidence for bowel obstruction. No intraperitoneal free air. No bowel wall thickening or inflammatory changes in the mesenteric. 5. Nondistended gallbladder with question of mild gallbladder wall thickening.  Ultrasound may prove helpful to further assess. If there is clinical concern for cholecystitis, nuclear scintigraphy may prove helpful to further evaluate. 6. Incomplete visualization right middle lobe nodule with similar appearance of the lingular nodule. Electronically Signed   By: Misty Stanley M.D.   On: 03/05/2017 16:59      IMPRESSION AND PLAN:   Hypercalcemia. The patient will be admitted to medical floor. Start normal saline IV, follow-up calcium level. Follow up oncology consult. Zometa IV if Dr. Grayland Ormond agrees.  Hyponatremia and dehydration. Start normal saline IV and follow-up BMP. Abnormal liver function test. Possible due to liver metastasis. Leukocytosis. Unclear etiology, urinalysis and chest x-ray are unremarkable.  Large cell  neuroendocrine lung cancer stage IV with liver metastasis. Follow-up with Dr. Grayland Ormond consult. Palliative care consult.   Hypertension. Continue lisinopril and Lopressor.  Generalized weakness. PT evaluation.  All the records are reviewed and case discussed with ED provider. Management plans discussed with the patient, her husband and 2 daughters and they are in agreement.  CODE STATUS: Full code for now  TOTAL TIME TAKING CARE OF THIS PATIENT: 53 minutes.    Demetrios Loll M.D on 03/05/2017 at 6:50 PM  Between 7am to 6pm - Pager - [phone]  After 6pm go to [email or web address] - Proofreader  [facility]  Office  [phone]  CC: Primary care physician; Barbaraann Boys, MD   Note: This dictation was prepared with Dragon dictation along with smaller phrase technology. Any transcriptional errors that result from this process are unin

## 2017-03-05 NOTE — ED Notes (Addendum)
Dr. Bridgett Larsson at pt's bedside.

## 2017-03-05 NOTE — ED Triage Notes (Signed)
PT sent to ED from [facility] center for admission. Pt was told she would need paracentesis and has multiple irregular lab values. Pt reports dizziness and weakness over the past 2 weeks. Pt is alert and oriented at this time.

## 2017-03-05 NOTE — Progress Notes (Signed)
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 Patient's AFP is normal, but the remainder of her tumor markers are significantly elevated. MRI of the brain is negative for metastatic disease. After lengthy discussion with the patient, she wishes to pursue aggressive treatment with chemotherapy using cisplatin and etoposide. Patient will also receive Neulasta support. Continue with dose reduced etoposide for cycle 4. Will re-image with PET scan on 02/24/17 and see Dr. Grayland Ormond on 02/27/17 for results. Proceed with cycle 4, day 1 of cisplatin and etoposide today. Return to clinic in 1 and 2 days for etoposide only.  2. Elevated liver enzymes/bilirubin: Likely secondary to progressive disease. Bilirubin normal. AST 46 (much better). Continue to monitor.   Interval history- Patient presents today for nausea, weakness, dizziness, confusion and abdominal bloating/pain. She states the symptoms have been going on for longer than one month. Her husband states she has gone downhill within the past month. She has stumbled and fallen to the ground one time but is very unsteady on her feet all the time. She also complains of "fullness" and  "abdominal pain" all the time and it hinders her ability to eat. She states her belly seems much larger then it used to be. Abdominal pain is in right upper and lower quadrants. She states  she is constantly nauseated but does admit to trying to drink plenty of fluids such as Gatorade and water. Most recently we have been trying to get a PET scan approved and is finally scheduled  For next week.  Last chemotherapy was 02/05/2017 (Etopiside only).   ECOG FS:1 - Symptomatic but completely ambulatory  Review of systems- Review of Systems  Constitutional: Positive for malaise/fatigue. Negative for chills and fever.  HENT: Negative.   Eyes: Negative.   Respiratory: Positive for cough.   Cardiovascular: Negative.  Negative for chest pain.  Gastrointestinal: Positive for abdominal pain, nausea and vomiting. Negative for constipation and diarrhea.  Genitourinary: Negative.   Musculoskeletal: Positive for falls.  Skin: Negative.   Neurological: Positive for dizziness and weakness.  Endo/Heme/Allergies: Negative.   Psychiatric/Behavioral: Positive for memory loss.    Current treatment- Last treatment was 02/05/2017 of Etopside only.   Allergies  Allergen Reactions  . Bee Venom Swelling  . Codeine Nausea And Vomiting    Pruritic rash  . Sulfa Antibiotics Other (See Comments)    Other reaction(s): Pruritic rash  . Penicillin G Rash    Has patient had a PCN reaction causing immediate rash, facial/tongue/throat swelling, SOB or lightheadedness with hypotension: Yes Has patient had a PCN reaction causing severe rash involving mucus membranes or skin necrosis: No Has patient had a PCN reaction that required hospitalization No Has patient had a PCN reaction occurring within the last 10 years: No If all of the above answers are "NO", then may proceed with Cephalosporin use.   Marland Kitchen Shellfish Allergy Rash     Past Medical History:  Diagnosis Date  . Cancer ([place])    lung cancer  . Coronary  artery disease    Cardiac catheterization in January of 2015 showed mild nonobstructive three-vessel coronary artery disease with significant mid LAD spasm which improved with nitroglycerin.  Metoprolol was switched to diltiazem  . Hyperlipidemia   . Hypertension   . PSVT (paroxysmal supraventricular tachycardia) ([place])      Past Surgical History:  Procedure Laterality Date  . CARDIAC CATHETERIZATION  06/2013   [facility]  . PORTA CATH INSERTION N/A 11/27/2016   Procedure: Glori Luis Cath Insertion;  Surgeon: Katha Cabal, MD;  Location: [facility];  Service: Cardiovascular;  Laterality: N/A;  . SUPRAVENTRICULAR TACHYCARDIA ABLATION N/A 04/22/2014   Procedure: SUPRAVENTRICULAR TACHYCARDIA ABLATION;  Surgeon: Evans Lance, MD;  Location: [facility] CATH LAB;  Service: Cardiovascular;  Laterality: N/A;  . TONSILLECTOMY    . TUBAL LIGATION      Social History   Social History  . Marital status: Married    Spouse name: N/A  . Number of children: N/A  . Years of education: N/A   Occupational History  . Not on file.   Social History Main Topics  . Smoking status: Current Some Day Smoker    Years: 30.00    Types: Cigarettes  . Smokeless tobacco: Never Used  . Alcohol use No  . Drug use: No  . Sexual activity: Not on file   Other Topics Concern  . Not on file   Social History Narrative  . No narrative on file    Family History  Problem Relation Age of Onset  . Stroke Mother   . Hypertension Mother   . Heart attack Father   . Thyroid cancer Sister   . Stroke Sister     No current facility-administered medications for this visit.  No current outpatient prescriptions on file.  Facility-Administered Medications Ordered in Other Visits:  .  0.9 %  sodium chloride infusion, , Intravenous, Continuous, Demetrios Loll, MD, Last Rate: 125 mL/hr at 03/06/17 0753 .  acetaminophen (TYLENOL) tablet 650 mg, 650 mg, Oral, Q6H PRN **OR** acetaminophen (TYLENOL) suppository 650 mg, 650 mg, Rectal, Q6H PRN, Demetrios Loll, MD .  albuterol (PROVENTIL) (2.5 MG/3ML) 0.083% nebulizer solution 2.5 mg, 2.5 mg, Nebulization, Q2H PRN, Demetrios Loll, MD .  bisacodyl (DULCOLAX) EC tablet 5 mg, 5  mg, Oral, Daily PRN, Demetrios Loll, MD .  docusate sodium (COLACE) capsule 100 mg, 100 mg, Oral, BID, Demetrios Loll, MD, 100 mg at 03/05/17 2321 .  enoxaparin (LOVENOX) injection 40 mg, 40 mg, Subcutaneous, Q24H, Demetrios Loll, MD, 40 mg at 03/05/17 2321 .  lisinopril (PRINIVIL,ZESTRIL) tablet 10 mg, 10 mg, Oral, Daily, Demetrios Loll, MD .  metoprolol tartrate (LOPRESSOR) tablet 50 mg, 50 mg, Oral, BID, Demetrios Loll, MD, 50 mg at 03/05/17 2321 .  ondansetron (ZOFRAN) tablet 4 mg, 4 mg, Oral, Q6H PRN **OR** ondansetron (ZOFRAN) injection 4 mg, 4 mg, Intravenous, Q6H PRN, Demetrios Loll, MD .  pantoprazole (PROTONIX) EC tablet 40 mg, 40 mg, Oral, Daily, Demetrios Loll, MD .  prochlorperazine (COMPAZINE) tablet 10 mg, 10 mg, Oral, Q6H PRN, Demetrios Loll, MD .  senna-docusate (Senokot-S) tablet 1 tablet, 1 tablet, Oral, QHS PRN, Demetrios Loll, MD  Physical exam:  Vitals:   03/05/17 1121  BP: 120/84  Pulse: (!) 137  Resp: 20  Temp: (!) 97.1 F (36.2 C)  TempSrc: Tympanic  Weight: 139 lb 6.4 oz (63.2 kg)   Physical Exam  Constitutional: She is oriented to person, place, and time. She appears dehydrated.  HENT:  Head: Normocephalic and  atraumatic.  Eyes: Pupils are equal, round, and reactive to light. Conjunctivae and EOM are normal.  Neck: Normal range of motion. Neck supple.  Cardiovascular: Regular rhythm.  Tachycardia present.   Pulmonary/Chest: Effort normal and breath sounds normal.  Abdominal: Bowel sounds are normal. She exhibits mass. There is tenderness.    Musculoskeletal: Normal range of motion.  Neurological: She is alert and oriented to person, place, and time.  Skin: Skin is warm and dry.  Psychiatric: Affect normal.     CMP Latest Ref Rng & Units 03/06/2017  Glucose 65 - 99 mg/dL 85  BUN 6 - 20 mg/dL 13  Creatinine 0.44 - 1.00 mg/dL 0.87  Sodium 135 - 145 mmol/L 132(L)  Potassium 3.5 - 5.1 mmol/L 4.1  Chloride 101 - 111 mmol/L 99(L)  CO2 22 - 32 mmol/L 23  Calcium 8.9 - 10.3 mg/dL 11.2(H)    Total Protein 6.5 - 8.1 g/dL 5.7(L)  Total Bilirubin 0.3 - 1.2 mg/dL 2.6(H)  Alkaline Phos 38 - 126 U/L 296(H)  AST 15 - 41 U/L 115(H)  ALT 14 - 54 U/L 62(H)   CBC Latest Ref Rng & Units 03/06/2017  WBC 3.6 - 11.0 K/uL 13.8(H)  Hemoglobin 12.0 - 16.0 g/dL 11.2(L)  Hematocrit 35.0 - 47.0 % 32.5(L)  Platelets 150 - 440 K/uL 122(L)    No images are attached to the encounter.  Dg Chest 1 View  Result Date: 03/05/2017 CLINICAL DATA:  Dizziness, weakness EXAM: CHEST 1 VIEW COMPARISON:  02/01/2017 FINDINGS: Mild linear scarring in the right mid lung. Lungs are otherwise clear. No pleural effusion or pneumothorax. The heart is normal in size. Right IJ chest port terminates at the cavoatrial junction. IMPRESSION: No evidence of acute cardiopulmonary disease. Electronically Signed   By: Julian Hy M.D.   On: 03/05/2017 18:09   Ct Head Wo Contrast  Result Date: 03/05/2017 CLINICAL DATA:  Dizziness and weakness over the past 2 weeks. Stage 4 large cell neuroendocrine carcinoma. Recent administration of intravenous contrast for abdominal CT. EXAM: CT HEAD WITHOUT CONTRAST TECHNIQUE: Contiguous axial images were obtained from the base of the skull through the vertex without intravenous contrast. COMPARISON:  02/01/2017 FINDINGS: Brain: No evidence for acute hemorrhage, mass lesion, midline shift, hydrocephalus or large infarct. Vascular: Vascular structures are slightly bright and probably related to recent contrast administration. Skull: Normal. Negative for fracture or focal lesion. Sinuses/Orbits: No acute finding. Other: None. IMPRESSION: No acute intracranial abnormality. Electronically Signed   By: Markus Daft M.D.   On: 03/05/2017 17:47   Ct Abdomen Pelvis W Contrast  Result Date: 03/05/2017 CLINICAL DATA:  Metastatic large cell neuroendocrine carcinoma. Severe right mid abdominal pain. EXAM: CT ABDOMEN AND PELVIS WITH CONTRAST TECHNIQUE: Multidetector CT imaging of the abdomen and pelvis  was performed using the standard protocol following bolus administration of intravenous contrast. CONTRAST:  131mL ISOVUE-300 IOPAMIDOL (ISOVUE-300) INJECTION 61% COMPARISON:  PET-CT 11/25/2016.  Abdomen and pelvis CT 11/07/2016 FINDINGS: Lower chest: Lingular nodule measures 1.3 cm on today's exam not substantially changed since PET-CT when it was measured at 1.5 cm right middle lobe irregular nodule has been incompletely visualized. Hepatobiliary: Innumerable metastatic lesions are identified in the liver. This is difficult to directly compare to the prior PET-CT performed without intravenous contrast material. Imaging features appear relatively stable when comparing to 11/07/2016 exam. A discrete lesion in the lateral segment left liver along the falciform ligament is 1.9 cm in diameter today which compares to 1.9 cm when I remeasure the same  lesion on the 11/07/2016 exam. Gallbladder is nondistended with a suggestion of gallbladder wall thickening. No intrahepatic or extrahepatic biliary dilation. Pancreas: Pancreatic parenchymal calcifications suggest chronic pancreatitis without peripancreatic edema or inflammation on today's study. No dilatation of the main pancreatic duct. Spleen: Unremarkable Adrenals/Urinary Tract: No adrenal nodule or mass. Kidneys are unremarkable. No evidence for hydroureter. The urinary bladder appears normal for the degree of distention. Stomach/Bowel: Stomach is nondistended. No gastric wall thickening. No evidence of outlet obstruction. Duodenum is normally positioned as is the ligament of Treitz. Multiple distal duodenal diverticuli are evident. No evidence of small-bowel obstruction. The terminal ileum is normal. The appendix is normal. Oral contrast material has migrated through to the mid colon. Vascular/Lymphatic: There is abdominal aortic atherosclerosis without aneurysm. Portal vein and superior mesenteric vein are patent. Splenic vein is patent. Mass-effect noted on the  intrahepatic hepatic veins due to metastatic disease. The celiac axis and SMA opacified. Similar appearance mild hepatoduodenal ligament lymphadenopathy. Small left para-aortic lymph nodes are similar to prior. No pelvic sidewall lymphadenopathy. Reproductive: The uterus has normal CT imaging appearance. There is no adnexal mass. Other: Small volume free fluid anterior to the liver is new in the interval. Trace free fluid is noted in the cul-de-sac. Musculoskeletal: Diffuse sclerotic bone lesions have clearly progressed since the PET-CT from 11/25/2016. IMPRESSION: 1. Interval development of a small amount of fluid anterior to the liver with trace free fluid noted in the cul-de-sac. 2. Innumerable hepatic metastases, similar in appearance to prior imaging studies. Small lymph nodes in the hepatoduodenal ligament are stable. 3. Interval progression of innumerable bony metastases since 11/25/2016. 4. No evidence for bowel obstruction. No intraperitoneal free air. No bowel wall thickening or inflammatory changes in the mesenteric. 5. Nondistended gallbladder with question of mild gallbladder wall thickening. Ultrasound may prove helpful to further assess. If there is clinical concern for cholecystitis, nuclear scintigraphy may prove helpful to further evaluate. 6. Incomplete visualization right middle lobe nodule with similar appearance of the lingular nodule. Electronically Signed   By: Misty Stanley M.D.   On: 03/05/2017 16:59     Assessment and plan- Patient is a 61 y.o. female he presents with dizziness, nausea, abdominal bloating, confusion and weakness. She appears pale. She is unsteady on her feet. Labs were drawn. Her bilirubin is elevated (3.4). Her liver enzymes are significantly elevated (137, 73). She is hyponatremic (128). Her kidney function is elevated (1.10). Her ammonia level is elevated (48). Her white blood cells are slightly elevated (13.7). Patient's left upper and lower quadrants of her  abdomen reveal a large mass and pain 8/10 when palpated. Bowel sounds are normal. She is tachycardic. Heart rate of 137.  1. Confusion: Labs including ammonia level and UA ordered. UA is negative. Ammonia elevated at 48. LFT's significantly elevated from last labs.  2. Hyponatremia: Sodium 128 today. Give 1 L Normal Saline.  3. Nausea/Vomiting: Give 10 mg Decadron and 8 mg Zofran with infusion. 4. Abdominal bloating/pain/elevated liver enzymes: STAT CT of abdomen/pelvis. 5. ED admission. Patient was taken directly from CT to the emergency room for further evaluation. Family very concerned about her safety at home given her confusion, dizziness, abdominal pain and lab abnormalities.   Visit Diagnosis 1. Confusion   2. Malignant ascites     Patient expressed understanding and was in agreement with this plan. She also understands that She can call clinic at any time with any questions, concerns, or complaints.    Faythe Casa, AGNP [place] at [facility]  [facility] Pager- [phone] 03/06/2017 9:31 AM

## 2017-03-05 NOTE — Progress Notes (Signed)
Patient here today as acute add on for weakness, dizziness and nausea.

## 2017-03-06 DIAGNOSIS — Z515 Encounter for palliative care: Secondary | ICD-10-CM

## 2017-03-06 DIAGNOSIS — Z66 Do not resuscitate: Secondary | ICD-10-CM

## 2017-03-06 DIAGNOSIS — R4182 Altered mental status, unspecified: Secondary | ICD-10-CM

## 2017-03-06 DIAGNOSIS — C799 Secondary malignant neoplasm of unspecified site: Secondary | ICD-10-CM

## 2017-03-06 LAB — CBC
HCT: 32.5 % — ABNORMAL LOW (ref 35.0–47.0)
Hemoglobin: 11.2 g/dL — ABNORMAL LOW (ref 12.0–16.0)
MCH: 35.4 pg — AB (ref 26.0–34.0)
MCHC: 34.6 g/dL (ref 32.0–36.0)
MCV: 102.2 fL — AB (ref 80.0–100.0)
PLATELETS: 122 10*3/uL — AB (ref 150–440)
RBC: 3.18 MIL/uL — ABNORMAL LOW (ref 3.80–5.20)
RDW: 16.1 % — AB (ref 11.5–14.5)
WBC: 13.8 10*3/uL — AB (ref 3.6–11.0)

## 2017-03-06 LAB — HEPATIC FUNCTION PANEL
ALT: 62 U/L — ABNORMAL HIGH (ref 14–54)
AST: 115 U/L — ABNORMAL HIGH (ref 15–41)
Albumin: 2.7 g/dL — ABNORMAL LOW (ref 3.5–5.0)
Alkaline Phosphatase: 296 U/L — ABNORMAL HIGH (ref 38–126)
BILIRUBIN DIRECT: 1.6 mg/dL — AB (ref 0.1–0.5)
BILIRUBIN INDIRECT: 1 mg/dL — AB (ref 0.3–0.9)
TOTAL PROTEIN: 5.7 g/dL — AB (ref 6.5–8.1)
Total Bilirubin: 2.6 mg/dL — ABNORMAL HIGH (ref 0.3–1.2)

## 2017-03-06 LAB — BASIC METABOLIC PANEL
Anion gap: 10 (ref 5–15)
BUN: 13 mg/dL (ref 6–20)
CALCIUM: 11.2 mg/dL — AB (ref 8.9–10.3)
CHLORIDE: 99 mmol/L — AB (ref 101–111)
CO2: 23 mmol/L (ref 22–32)
CREATININE: 0.87 mg/dL (ref 0.44–1.00)
GFR calc Af Amer: >60 mL/min (ref >60)
GFR calc non Af Amer: >60 mL/min (ref >60)
Glucose, Bld: 85 mg/dL (ref 65–99)
Potassium: 4.1 mmol/L (ref 3.5–5.1)
SODIUM: 132 mmol/L — AB (ref 135–145)

## 2017-03-06 MED ORDER — ENSURE ENLIVE PO LIQD
237.0000 mL | Freq: Two times a day (BID) | ORAL | Status: DC
  Administered 2017-03-07: 237 mL via ORAL

## 2017-03-06 MED ORDER — ZOLPIDEM TARTRATE 5 MG PO TABS
5.0000 mg | ORAL_TABLET | Freq: Every evening | ORAL | Status: DC | PRN
  Filled 2017-03-06: qty 1

## 2017-03-06 MED ORDER — SODIUM CHLORIDE 0.9% FLUSH: 10.0000 mL | INTRAVENOUS | Status: DC | PRN

## 2017-03-06 MED ORDER — LORAZEPAM 2 MG/ML PO CONC
0.2500 mg | Freq: Four times a day (QID) | ORAL | Status: DC | PRN
  Administered 2017-03-07: 04:00:00 0.5 mg via ORAL
  Filled 2017-03-06: qty 1

## 2017-03-06 MED ORDER — MORPHINE SULFATE (CONCENTRATE) 10 MG/0.5ML PO SOLN: 5.0000 mg | ORAL | Status: DC | PRN

## 2017-03-06 MED ORDER — ZOLEDRONIC ACID 4 MG/5ML IV CONC
4.0000 mg | Freq: Once | INTRAVENOUS | Status: AC
  Administered 2017-03-06: 12:00:00 4 mg via INTRAVENOUS
  Filled 2017-03-06: qty 5

## 2017-03-06 NOTE — Progress Notes (Signed)
Doctor asked [place] to speak to pt's husband whom she had delivered bad news to about his wife's health status. Pt's husband was broken, tearful and confused. CH provide emotional support, prayer and pastoral presence. CH will follow up pt and husband as needed.   03/06/17 1100  Clinical Encounter Type  Visited With Patient and family together  Visit Type Initial;Spiritual support;Other (Comment)  Referral From Physician  Consult/Referral To Chaplain  Spiritual Encounters  Spiritual Needs Prayer;Emotional;Other (Comment)

## 2017-03-06 NOTE — Care Management Note (Signed)
Case Management Note  Patient Details  Name: ALEYDA GINDLESPERGER MRN: [id number] Date of Birth: 02/01/1956  Subjective/Objective:  Hospice consult received. Met with patients spouse outside of room. He is extremely hard of hearing. Very difficult situation. Due to his hearing loss he was not aware his wife was doing so poorly until  Palliative spoke with him yesterday. He just lost his daughter in law 1 moth ago at the age of 69. He has a strong faith and church family.  Discussed hospice with spouse and he prefers [place]. Referral to [place] at [facility]. It is anticipated patient will be ready for discharge in 1-2 days                  Action/Plan: Referral to [place] for home hospice services.   Expected Discharge Date:                  Expected Discharge Plan:  Home w Hospice Care  In-House Referral:     Discharge planning Services  CM Consult  Post Acute Care Choice:  Hospice Choice offered to:  Adult Children  DME Arranged:    DME Agency:     HH Arranged:  Disease Management [place] Agency:  [facility]  Status of Service:  In process, will continue to follow  If discussed at Long Length of Stay Meetings, dates discussed:    Additional Comments:  Jolly Mango, RN 03/06/2017, 3:17 PM

## 2017-03-06 NOTE — Progress Notes (Signed)
Medications administered by student RN [id number] with supervision of Clinical Instructor Kagan Mutchler MSN, RN-BC or patient's assigned RN.   

## 2017-03-06 NOTE — Progress Notes (Signed)
[facility] NAME: Latasha [name]    MR#:  [id number]  DATE OF BIRTH:  02/25/1956  SUBJECTIVE:   No acute events overnight.  REVIEW OF SYSTEMS:    Review of Systems  Constitutional: Negative for fever, chills weight loss HENT: Negative for ear pain, nosebleeds, congestion, facial swelling, rhinorrhea, neck pain, neck stiffness and ear discharge.   Respiratory: Negative for cough, shortness of breath, wheezing  Cardiovascular: Negative for chest pain, palpitations and leg swelling.  Gastrointestinal: Negative for heartburn, Positive abdominal pain, no vomiting, diarrhea or consitpation Genitourinary: Negative for dysuria, urgency, frequency, hematuria Musculoskeletal: Negative for back pain or joint pain Neurological: Negative for dizziness, seizures, syncope, focal weakness,  numbness and headaches.  Hematological: Does not bruise/bleed easily.  Psychiatric/Behavioral: Negative for hallucinations, confusion, dysphoric mood    Tolerating Diet: yes      DRUG ALLERGIES:   Allergies  Allergen Reactions  . Bee Venom Swelling  . Codeine Nausea And Vomiting    Pruritic rash  . Sulfa Antibiotics Other (See Comments)    Other reaction(s): Pruritic rash  . Penicillin G Rash    Has patient had a PCN reaction causing immediate rash, facial/tongue/throat swelling, SOB or lightheadedness with hypotension: Yes Has patient had a PCN reaction causing severe rash involving mucus membranes or skin necrosis: No Has patient had a PCN reaction that required hospitalization No Has patient had a PCN reaction occurring within the last 10 years: No If all of the above answers are "NO", then may proceed with Cephalosporin use.   . Shellfish Allergy Rash    VITALS:  Blood pressure (!) 166/98, pulse 90, temperature (!) 97.5 F (36.4 C), temperature source Oral, resp. rate 20, height 5\' 5"  (1.651 m), weight 64.9 kg (143 lb 1.6 oz), SpO2 95  %.  PHYSICAL EXAMINATION:  Constitutional: Appears well-developed and well-nourished. No distress. HENT: Normocephalic. Marland Kitchen Oropharynx is clear and moist.  Eyes: Conjunctivae and EOM are normal. PERRLA, no scleral icterus.  Neck: Normal ROM. Neck supple. No JVD. No tracheal deviation. CVS: RRR, S1/S2 +, no murmurs, no gallops, no carotid bruit.  Pulmonary: Effort and breath sounds normal, no stridor, rhonchi, wheezes, rales.  Abdominal: Soft. BS +,  no distension, tenderness, rebound or guarding.  ++ Hepatomegaly  Musculoskeletal: Normal range of motion. No edema and no tenderness.  Neuro: Alert. CN 2-12 grossly intact. No focal deficits. Skin: Skin is warm and dry. No rash noted. Psychiatric: Normal mood and affect.      LABORATORY PANEL:   CBC  Recent Labs Lab 03/06/17 0510  WBC 13.8*  HGB 11.2*  HCT 32.5*  PLT 122*   ------------------------------------------------------------------------------------------------------------------  Chemistries   Recent Labs Lab 03/06/17 0510  NA 132*  K 4.1  CL 99*  CO2 23  GLUCOSE 85  BUN 13  CREATININE 0.87  CALCIUM 11.2*  AST 115*  ALT 62*  ALKPHOS 296*  BILITOT 2.6*   ------------------------------------------------------------------------------------------------------------------  Cardiac Enzymes No results for input(s): TROPONINI in the last 168 hours. ------------------------------------------------------------------------------------------------------------------  RADIOLOGY:  Dg Chest 1 View  Result Date: 03/05/2017 CLINICAL DATA:  Dizziness, weakness EXAM: CHEST 1 VIEW COMPARISON:  02/01/2017 FINDINGS: Mild linear scarring in the right mid lung. Lungs are otherwise clear. No pleural effusion or pneumothorax. The heart is normal in size. Right IJ chest port terminates at the cavoatrial junction. IMPRESSION: No evidence of acute cardiopulmonary disease. Electronically Signed   By: Julian Hy M.D.   On:  03/05/2017 18:09  Ct Head Wo Contrast  Result Date: 03/05/2017 CLINICAL DATA:  Dizziness and weakness over the past 2 weeks. Stage 4 large cell neuroendocrine carcinoma. Recent administration of intravenous contrast for abdominal CT. EXAM: CT HEAD WITHOUT CONTRAST TECHNIQUE: Contiguous axial images were obtained from the base of the skull through the vertex without intravenous contrast. COMPARISON:  02/01/2017 FINDINGS: Brain: No evidence for acute hemorrhage, mass lesion, midline shift, hydrocephalus or large infarct. Vascular: Vascular structures are slightly bright and probably related to recent contrast administration. Skull: Normal. Negative for fracture or focal lesion. Sinuses/Orbits: No acute finding. Other: None. IMPRESSION: No acute intracranial abnormality. Electronically Signed   By: Markus Daft M.D.   On: 03/05/2017 17:47   Ct Abdomen Pelvis W Contrast  Result Date: 03/05/2017 CLINICAL DATA:  Metastatic large cell neuroendocrine carcinoma. Severe right mid abdominal pain. EXAM: CT ABDOMEN AND PELVIS WITH CONTRAST TECHNIQUE: Multidetector CT imaging of the abdomen and pelvis was performed using the standard protocol following bolus administration of intravenous contrast. CONTRAST:  111mL ISOVUE-300 IOPAMIDOL (ISOVUE-300) INJECTION 61% COMPARISON:  PET-CT 11/25/2016.  Abdomen and pelvis CT 11/07/2016 FINDINGS: Lower chest: Lingular nodule measures 1.3 cm on today's exam not substantially changed since PET-CT when it was measured at 1.5 cm right middle lobe irregular nodule has been incompletely visualized. Hepatobiliary: Innumerable metastatic lesions are identified in the liver. This is difficult to directly compare to the prior PET-CT performed without intravenous contrast material. Imaging features appear relatively stable when comparing to 11/07/2016 exam. A discrete lesion in the lateral segment left liver along the falciform ligament is 1.9 cm in diameter today which compares to 1.9 cm when  I remeasure the same lesion on the 11/07/2016 exam. Gallbladder is nondistended with a suggestion of gallbladder wall thickening. No intrahepatic or extrahepatic biliary dilation. Pancreas: Pancreatic parenchymal calcifications suggest chronic pancreatitis without peripancreatic edema or inflammation on today's study. No dilatation of the main pancreatic duct. Spleen: Unremarkable Adrenals/Urinary Tract: No adrenal nodule or mass. Kidneys are unremarkable. No evidence for hydroureter. The urinary bladder appears normal for the degree of distention. Stomach/Bowel: Stomach is nondistended. No gastric wall thickening. No evidence of outlet obstruction. Duodenum is normally positioned as is the ligament of Treitz. Multiple distal duodenal diverticuli are evident. No evidence of small-bowel obstruction. The terminal ileum is normal. The appendix is normal. Oral contrast material has migrated through to the mid colon. Vascular/Lymphatic: There is abdominal aortic atherosclerosis without aneurysm. Portal vein and superior mesenteric vein are patent. Splenic vein is patent. Mass-effect noted on the intrahepatic hepatic veins due to metastatic disease. The celiac axis and SMA opacified. Similar appearance mild hepatoduodenal ligament lymphadenopathy. Small left para-aortic lymph nodes are similar to prior. No pelvic sidewall lymphadenopathy. Reproductive: The uterus has normal CT imaging appearance. There is no adnexal mass. Other: Small volume free fluid anterior to the liver is new in the interval. Trace free fluid is noted in the cul-de-sac. Musculoskeletal: Diffuse sclerotic bone lesions have clearly progressed since the PET-CT from 11/25/2016. IMPRESSION: 1. Interval development of a small amount of fluid anterior to the liver with trace free fluid noted in the cul-de-sac. 2. Innumerable hepatic metastases, similar in appearance to prior imaging studies. Small lymph nodes in the hepatoduodenal ligament are stable. 3.  Interval progression of innumerable bony metastases since 11/25/2016. 4. No evidence for bowel obstruction. No intraperitoneal free air. No bowel wall thickening or inflammatory changes in the mesenteric. 5. Nondistended gallbladder with question of mild gallbladder wall thickening. Ultrasound may prove helpful to further  assess. If there is clinical concern for cholecystitis, nuclear scintigraphy may prove helpful to further evaluate. 6. Incomplete visualization right middle lobe nodule with similar appearance of the lingular nodule. Electronically Signed   By: Misty Stanley M.D.   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  1. Acute metabolic encephalopathy in the setting of hypercalcemia and hyponatremia. Mental status is improved   2. Hypercalcemia from underlying cancer: Continue IV fluids Await oncology evaluation Consider Zometa  3. Hyponatremia from poor by mouth intake: Sodium level is improving Continue IV fluids  4. 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  Palliative care consultation requested  5. Essential hypertension: Continue lisinopril and metoprolol  6. Elevated LFTs in the setting of liver metastases  Management plans discussed with the patient and she is in agreement.  CODE STATUS: partial  TOTAL TIME TAKING CARE OF THIS PATIENT: 30 minutes.     POSSIBLE D/C 1-2 days, DEPENDING ON CLINICAL CONDITION.   Nesta Scaturro M.D on 03/06/2017 at 8:16 AM  Between 7am to 6pm - Pager - [phone] After 6pm go to [email or web address] - password [email or web address] [facility]  Office  [phone]  CC: Primary care physician; Barbaraann Boys, MD  Note: This dictation was  prepared with Dragon dictation along with smaller phrase technology. Any transcriptional errors that result from this process are unintentional.

## 2017-03-06 NOTE — Progress Notes (Signed)
New referral for hospice at home upon discharge from [facility]  Referral information sent to referral intake.  Liaison will meet with family tomorrow to discuss hospice services and DME needs.  Orvan July, RNCM aware and states patient for discharge in 1-2 days. Will continue to follow through final disposition.

## 2017-03-06 NOTE — Consult Note (Signed)
Consultation Note Date: 03/06/2017   [name] Name: Latasha [name]  DOB: [birth year]  MRN: [id number]  Age / Sex: 61 y.o., female  PCP: Latasha Boys, MD Referring Physician: Bettey Costa, MD  Reason for Consultation: Establishing goals of care  HPI/[name] Profile: 61 y.o. 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 Latasha [name] is followed by Latasha [name]. She has been treated with IVF and lasix.  Latasha confusion is beginning to clear.   Clinical Assessment and Goals of Care:  I have reviewed medical records including EPIC notes, labs and imaging, received report from Latasha [name], assessed Latasha [name] and then met at Latasha bedside along with Latasha husband and 3 daughters to discuss diagnosis prognosis, GOC, EOL wishes, disposition and options.  I introduced Palliative Medicine as specialized medical care for people living with serious illness. It focuses on providing relief from Latasha symptoms and stress of a serious illness. Latasha goal is to improve quality of life for both Latasha [name] and Latasha family.  We discussed a brief life review of Latasha [name].  She worked at [employer] recently and is currently still employed (on disability).  She lives at home with Latasha husband.  They have a deep religious faith.    As far as functional and nutritional status - shortly  prior to admission she was eating, walking, and functional.  She had hypercalemia several months ago and received Zometa which resolved Latasha symptoms of confusion and lack of balance for several months.  Now they have returned.   In a conference room (as Latasha [name] was confused) we discussed Latasha current illness and reviewed Latasha recent CT scan.  Despite receiving 3 months of chemotherapy Latasha cancer has progressed.  Latasha CT scan from  9/19 shows interval progression particularly in Latasha pelvis/spine.  Latasha daughters expressed anger.  They feel this was sudden and unexpected.  Mr. Shearon was very emotional - he as well was quite surprised by Latasha news that Latasha life is very limited.    We developed a plan to give Latasha [name] Zometa today to try to help Latasha symptoms.  She would then go home with Hospice Services.  Latasha [name] refused hospice house.   Dtrs were torn - they were concerned about Latasha [name]'s husband being unable to care for Latasha at home.  Latasha [name] to take Latasha [name] to Latasha house and care for Latasha.  Latasha [name] brought up Latasha subject of code status.  Given Latasha fast spread of bony mets Latasha daughters felt that CPR would crush their [name].  Latasha [name] is Latasha [facility].  As Latasha [name] is confused, Latasha [name] elected DNR for Latasha [name].  Latasha other daughters and husband agreed.   Primary Decision Maker:  Latasha [name]    SUMMARY OF RECOMMENDATIONS    Zometa to be given today (family understands it only helps symptoms but does not change Latasha spread of Latasha cancer.  [name] to go home (or to daughters home) with hospice services.  Family knows they can transition to hospice house if needed later   Code Status/Advance Care Planning:  DNR   Symptom Management:   Zometa x 1 infusion today.   Psycho-social/Spiritual:   Desire for further Chaplaincy support: yes.  Chaplain Latasha [name] was a tremendous support to Latasha husband and family.  Prognosis:   Weeks given very aggressive metastatic spread, hypercalcemia, elevated LFTs  Discharge Planning: Home with Hospice      Primary Diagnoses: Present on Admission: . Hypercalcemia   I have reviewed Latasha medical record, interviewed Latasha [name] and family, and examined Latasha [name]. Latasha following aspects are pertinent.  Past Medical History:  Diagnosis Date  . Cancer (Latasha [name])    lung cancer  . Coronary artery disease    Cardiac catheterization in January of 2015 showed mild  nonobstructive three-vessel coronary artery disease with significant mid LAD spasm which improved with nitroglycerin. Metoprolol was switched to diltiazem  . Hyperlipidemia   . Hypertension   . PSVT (paroxysmal supraventricular tachycardia) (HCC)    Social History   Social History  . Marital status: Married    Spouse name: N/A  . Number of children: N/A  . Years of education: N/A   Social History Main Topics  . Smoking status: Current Some Day Smoker    Years: 30.00    Types: Cigarettes  . Smokeless tobacco: Never Used  . Alcohol use No  . Drug use: No  . Sexual activity: Not Asked   Other Topics Concern  . None   Social History Narrative  . None   Family History  Problem Relation Age of Onset  . Stroke [name]   . Hypertension [name]   . Heart attack Father   . Thyroid cancer Sister   . Stroke Sister    Scheduled Meds: . docusate sodium  100 mg Oral BID  . enoxaparin (LOVENOX) injection  40 mg Subcutaneous Q24H  . lisinopril  10 mg Oral Daily  . metoprolol tartrate  50 mg Oral BID  . pantoprazole  40 mg Oral Daily   Continuous Infusions: . sodium chloride 125 mL/hr at 03/06/17 0753   PRN Meds:.acetaminophen **OR** acetaminophen, albuterol, bisacodyl, ondansetron **OR** ondansetron (ZOFRAN) IV, prochlorperazine, senna-docusate Allergies  Allergen Reactions  . Bee Venom Swelling  . Codeine Nausea And Vomiting    Pruritic rash  . Sulfa Antibiotics Other (See Comments)    Other reaction(s): Pruritic rash  . Penicillin G Rash    Has [name] had a PCN reaction causing immediate rash, facial/tongue/throat swelling, SOB or lightheadedness with hypotension: Yes Has [name] had a PCN reaction causing severe rash involving mucus membranes or skin necrosis: No Has [name] had a PCN reaction that required hospitalization No Has [name] had a PCN reaction occurring within Latasha last 10 years: No If all of Latasha above answers are "NO", then may proceed with Cephalosporin  use.   . Shellfish Allergy Rash   Review of Systems [name] confused but denies pain  Physical Exam  Well developed female.  Pleasantly confused.  Appears chronically ill CV rrr Resp no distress Abdomen 3+hepatomegaly, otherwise NT,ND Ext no edema.  Vital Signs: BP (!) (P) 153/84 (BP Location: Left Arm)   Pulse (!) (P) 106   Temp (P) 98.2 F (36.8 C) (Oral)   Resp (P) 20   Ht 5' 5"  (1.651 m)   Wt 64.9 kg (143 lb 1.6 oz)   SpO2 (P) 97%   BMI  23.81 kg/m  Pain Assessment: (P) 0-10   Pain Score: (P) 2    SpO2: SpO2: (P) 97 % O2 Device:SpO2: (P) 97 % O2 Flow Rate: .   IO: Intake/output summary:  Intake/Output Summary (Last 24 hours) at 03/06/17 0955 Last data filed at 03/06/17 0830  Gross per 24 hour  Intake            862.5 ml  Output              100 ml  Net            762.5 ml    LBM: Last BM Date: 03/06/17 Baseline Weight: Weight: 63 kg (139 lb) Most recent weight: Weight: 64.9 kg (143 lb 1.6 oz)     Palliative Assessment/Data:     Time In: 9:00 Time Out: 10:30 Time Total: 90 min. Greater than 50%  of this time was spent counseling and coordinating care related to Latasha above assessment and plan.  Signed by: Florentina Jenny, PA-C Palliative Medicine Pager: [phone]  Please contact Palliative Medicine Team phone at [phone] for questions and concerns.  For individual provider: See Shea Evans

## 2017-03-06 NOTE — Progress Notes (Signed)
PT Cancellation Note  Patient Details Name: Latasha [name] MRN: [id number] DOB: 1956-04-11   Cancelled Treatment:    Reason Eval/Treat Not Completed:  (Order cancelled secondary to palliative considerations) Did do brief walking bout with pt, HR increased from low 100s to >130 quickly with ~60 ft of ambulation.  Pt felt as though she could do more, but was also fatigued.  PT orders cancelled, pt does not need further PT intervention.   Kreg Shropshire, DPT 03/06/2017, 3:37 PM

## 2017-03-06 NOTE — Progress Notes (Signed)
Initial Nutrition Assessment  DOCUMENTATION CODES:   Not applicable  INTERVENTION:  Provide Ensure Enlive po BID, each supplement provides 350 kcal and 20 grams of protein. Patient would like to have Ensure served with a straw.  Will monitor outcome of discussions regarding goals of care.  NUTRITION DIAGNOSIS:   Inadequate oral intake related to poor appetite, cancer and cancer related treatments as evidenced by per patient/family report.  GOAL:   Patient will meet greater than or equal to 90% of their needs  MONITOR:   PO intake, Supplement acceptance, Labs, Weight trends, I & O's  REASON FOR ASSESSMENT:   Malnutrition Screening Tool    ASSESSMENT:   61 year old female with PMHx of large cell neuroendocrine lung cancer with metastasis to liver and bone, CAD, HTN, HLD who presented with generalized weakness, confusion, and found to have hypercalcemia, hyponatremia, dehydration.   -Patient s/p cycle 4, day 1 of cisplatin and etoposide on 8/20. -PMT has been consulted to discuss goals of care.  Spoke with patient and husband at bedside. Patient confused and not able to provide a good history. She reports she has not been eating well, but is unable to provide specifics. Husband does not offer up any details on patient's intake. She reports she enjoys the food here and has eaten well today. Patient denies any N/V, abdominal pain. She requests Ensure, and would like it provided with a straw.  Patient believes she has lost approximately 9 lbs. Per chart she was 146 lbs on 11/18/2016 and 139.4 lbs on 03/05/2017. She has lost 6.6 lbs (4.5% body weight) over 3 months, which is not significant for time frame.  Meal Completion: 80-100% per chart  Medications reviewed and include: Colace, pantoprazole, NS @ 125 ml/hr.  Labs reviewed: Sodium 132, Chloride 99.  Nutrition-Focused physical exam completed. Findings are mild fat depletion (noted only in upper arm region), mild muscle  depletion (noted in temple region and clavicle/acromion region), and no edema.   Patient does not meet criteria for malnutrition at this time but is at risk for malnutrition.  Diet Order:  Diet regular Room service appropriate? Yes; Fluid consistency: Thin  Skin:  Reviewed, no issues  Last BM:  03/06/2017 - small type 4  Height:   Ht Readings from Last 1 Encounters:  03/05/17 5\' 5"  (1.651 m)    Weight:   Wt Readings from Last 1 Encounters:  03/05/17 143 lb 1.6 oz (64.9 kg)    Ideal Body Weight:  56.8 kg  BMI:  Body mass index is 23.81 kg/m.  Estimated Nutritional Needs:   Kcal:  1585-1830 (MSJ x 1.3-1.5)  Protein:  80-97 grams (1.2-1.5 grams/kg)  Fluid:  1.9-2.3 L/day (30-35 ml/kg)  EDUCATION NEEDS:   No education needs identified at this time  Willey Blade, MS, RD, LDN Pager: [phone] After Hours Pager: [phone]

## 2017-03-07 ENCOUNTER — Telehealth: Payer: Self-pay | Admitting: *Deleted

## 2017-03-07 DIAGNOSIS — I471 Supraventricular tachycardia: Secondary | ICD-10-CM

## 2017-03-07 DIAGNOSIS — R630 Anorexia: Secondary | ICD-10-CM

## 2017-03-07 DIAGNOSIS — R5383 Other fatigue: Secondary | ICD-10-CM

## 2017-03-07 DIAGNOSIS — Z9221 Personal history of antineoplastic chemotherapy: Secondary | ICD-10-CM

## 2017-03-07 DIAGNOSIS — Z515 Encounter for palliative care: Secondary | ICD-10-CM

## 2017-03-07 DIAGNOSIS — I2510 Atherosclerotic heart disease of native coronary artery without angina pectoris: Secondary | ICD-10-CM

## 2017-03-07 DIAGNOSIS — E785 Hyperlipidemia, unspecified: Secondary | ICD-10-CM

## 2017-03-07 DIAGNOSIS — Z808 Family history of malignant neoplasm of other organs or systems: Secondary | ICD-10-CM

## 2017-03-07 DIAGNOSIS — Z79899 Other long term (current) drug therapy: Secondary | ICD-10-CM

## 2017-03-07 DIAGNOSIS — R109 Unspecified abdominal pain: Secondary | ICD-10-CM

## 2017-03-07 DIAGNOSIS — R410 Disorientation, unspecified: Secondary | ICD-10-CM

## 2017-03-07 DIAGNOSIS — C7A8 Other malignant neuroendocrine tumors: Secondary | ICD-10-CM

## 2017-03-07 DIAGNOSIS — R4182 Altered mental status, unspecified: Secondary | ICD-10-CM

## 2017-03-07 DIAGNOSIS — C799 Secondary malignant neoplasm of unspecified site: Secondary | ICD-10-CM

## 2017-03-07 DIAGNOSIS — Z7901 Long term (current) use of anticoagulants: Secondary | ICD-10-CM

## 2017-03-07 DIAGNOSIS — I10 Essential (primary) hypertension: Secondary | ICD-10-CM

## 2017-03-07 DIAGNOSIS — R110 Nausea: Secondary | ICD-10-CM

## 2017-03-07 DIAGNOSIS — F17210 Nicotine dependence, cigarettes, uncomplicated: Secondary | ICD-10-CM

## 2017-03-07 DIAGNOSIS — R531 Weakness: Secondary | ICD-10-CM

## 2017-03-07 DIAGNOSIS — Z66 Do not resuscitate: Secondary | ICD-10-CM

## 2017-03-07 LAB — COMPREHENSIVE METABOLIC PANEL
ALK PHOS: 369 U/L — AB (ref 38–126)
ALT: 64 U/L — AB (ref 14–54)
ANION GAP: 11 (ref 5–15)
AST: 123 U/L — ABNORMAL HIGH (ref 15–41)
Albumin: 2.7 g/dL — ABNORMAL LOW (ref 3.5–5.0)
BILIRUBIN TOTAL: 4.6 mg/dL — AB (ref 0.3–1.2)
BUN: 13 mg/dL (ref 6–20)
CALCIUM: 11 mg/dL — AB (ref 8.9–10.3)
CO2: 22 mmol/L (ref 22–32)
CREATININE: 0.85 mg/dL (ref 0.44–1.00)
Chloride: 101 mmol/L (ref 101–111)
Glucose, Bld: 70 mg/dL (ref 65–99)
Potassium: 3.8 mmol/L (ref 3.5–5.1)
SODIUM: 134 mmol/L — AB (ref 135–145)
TOTAL PROTEIN: 5.6 g/dL — AB (ref 6.5–8.1)

## 2017-03-07 MED ORDER — ENSURE ENLIVE PO LIQD: 237.0000 mL | Freq: Two times a day (BID) | ORAL | 12 refills | Status: AC

## 2017-03-07 MED ORDER — LORAZEPAM 2 MG/ML PO CONC: 0.2500 mg | Freq: Four times a day (QID) | ORAL | 0 refills | Status: AC | PRN

## 2017-03-07 MED ORDER — HEPARIN SOD (PORK) LOCK FLUSH 100 UNIT/ML IV SOLN
500.0000 [IU] | Freq: Once | INTRAVENOUS | Status: AC
  Administered 2017-03-07: 500 [IU] via INTRAVENOUS
  Filled 2017-03-07: qty 5

## 2017-03-07 NOTE — Progress Notes (Signed)
Palliative:  Void MOST form per patient and daughter/HCPOA Caryl Pina.  Plan is home with hospice. I spoke with patient, husband, and daughter Caryl Pina. There is much confusion and concern from [place]. Patient and husband desire home with hospice. Caryl Pina strongly believes that he cannot care for her and that she will fall and further injure herself causing more suffering. After much discussion it was agreed that plan remain home with hospice and that her daughters be there this weekend as much as possible to assist with care needs. Explained that the option is always there to go from home to the hospice facility if appropriate and when care needs cannot be met at home. Family really not on the same page. Hospice aware of circumstances.   25 min  Vinie Sill, NP Palliative Medicine Team Pager # [phone] (M-F 8a-5p) Team Phone # [phone] (Nights/Weekends)

## 2017-03-07 NOTE — Discharge Summary (Signed)
[facility] NAME: Latasha [name]    MR#:  [id number]  DATE OF BIRTH:  March 21, 1956  DATE OF ADMISSION:  03/05/2017 ADMITTING PHYSICIAN: Demetrios Loll, MD  DATE OF DISCHARGE: 03/07/2017  PRIMARY CARE PHYSICIAN: Barbaraann Boys, MD    ADMISSION DIAGNOSIS:  Hypercalcemia [E83.52] Dehydration [E86.0] Hyponatremia [E87.1] Altered mental status, unspecified altered mental status type [R41.82] Fatigue, unspecified type [R53.83]  DISCHARGE DIAGNOSIS:  Active Problems:   Hypercalcemia   SECONDARY DIAGNOSIS:   Past Medical History:  Diagnosis Date  . Cancer ([facility])    lung cancer  . Coronary artery disease    Cardiac catheterization in January of 2015 showed mild nonobstructive three-vessel coronary artery disease with significant mid LAD spasm which improved with nitroglycerin. Metoprolol was switched to diltiazem  . Hyperlipidemia   . Hypertension   . 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  1. Acute metabolic encephalopathy in the setting of hypercalcemia and hyponatremia. Mental status has improved   2. Hypercalcemia from underlying cancer: She was given a dose of Zometa per The request of oncology.  3. Hyponatremia from poor by mouth intake: Sodium level has improved with IV fluids.  4. Progressive Stage IV large cell neuroendocrine carcinoma with widespread metastatic disease:    patient was seen and evaluated by palliative care as well as oncology. She has progressive disease. Given patient's declining performance setting worsening liver function no further treatments are planned. Hospice care was discussed at length and the family has decided on home with hospice.   5. Essential  hypertension: Continue lisinopril and metoprolol  6. Elevated LFTs in the setting of liver metastases    DISCHARGE CONDITIONS AND DIET:  Fair condition Discharge on regular diet  CONSULTS OBTAINED:  Treatment Team:  Lloyd Huger, MD  DRUG ALLERGIES:   Allergies  Allergen Reactions  . Bee Venom Swelling  . Codeine Nausea And Vomiting    Pruritic rash  . Sulfa Antibiotics Other (See Comments)    Other reaction(s): Pruritic rash  . Penicillin G Rash    Has patient had a PCN reaction causing immediate rash, facial/tongue/throat swelling, SOB or lightheadedness with hypotension: Yes Has patient had a PCN reaction causing severe rash involving mucus membranes or skin necrosis: No Has patient had a PCN reaction that required hospitalization No Has patient had a PCN reaction occurring within the last 10 years: No If all of the above answers are "NO", then may proceed with Cephalosporin use.   . Shellfish Allergy Rash    DISCHARGE MEDICATIONS:   Current Discharge Medication List    START taking these medications   Details  feeding supplement, ENSURE ENLIVE, (ENSURE ENLIVE) LIQD Take 237 mLs by mouth 2 (two) times daily between meals. Qty: 237 mL, Refills: 12    LORazepam (ATIVAN) 2 MG/ML concentrated solution Take 0.1-0.3 mLs (0.2-0.6 mg total) by mouth every 6 (six) hours as needed for anxiety or sleep. Qty: 30 mL, Refills: 0      CONTINUE these medications which have NOT CHANGED   Details  lidocaine-prilocaine (EMLA) cream Apply to affected area once Qty: 30 g, Refills: 3   Associated Diagnoses: Large cell neuroendocrine carcinoma (HCC)    lisinopril (PRINIVIL,ZESTRIL) 10 MG tablet Take 10 mg by mouth daily.  metoprolol tartrate (LOPRESSOR) 50 MG tablet Take 1 tablet (50 mg total) by mouth 2 (two) times daily. Qty: 60 tablet, Refills: 3    omeprazole (PRILOSEC) 20 MG capsule Take 40 mg by mouth daily.   Associated Diagnoses: Large cell neuroendocrine  carcinoma (HCC)    ondansetron (ZOFRAN) 8 MG tablet Take 1 tablet (8 mg total) by mouth 2 (two) times daily as needed. Start on the third day after cisplatin chemotherapy. Qty: 60 tablet, Refills: 2   Associated Diagnoses: Large cell neuroendocrine carcinoma ([place])      STOP taking these medications     ibuprofen (ADVIL,MOTRIN) 200 MG tablet      prochlorperazine (COMPAZINE) 10 MG tablet           Today   CHIEF COMPLAINT:   Tearful this morning. Ready to be discharged home.   VITAL SIGNS:  Blood pressure (!) 141/89, pulse 97, temperature 97.9 F (36.6 C), temperature source Oral, resp. rate 15, height 5\' 5"  (1.651 m), weight 64.9 kg (143 lb 1.6 oz), SpO2 94 %.   REVIEW OF SYSTEMS:  Review of Systems  Constitutional: Positive for malaise/fatigue. Negative for chills and fever.  HENT: Negative.  Negative for ear discharge, ear pain, hearing loss, nosebleeds and sore throat.   Eyes: Negative.  Negative for blurred vision and pain.  Respiratory: Negative.  Negative for cough, hemoptysis, shortness of breath and wheezing.   Cardiovascular: Negative.  Negative for chest pain, palpitations and leg swelling.  Gastrointestinal: Negative.  Negative for abdominal pain, blood in stool, diarrhea, nausea and vomiting.  Genitourinary: Negative.  Negative for dysuria.  Musculoskeletal: Negative.  Negative for back pain.  Skin: Negative.   Neurological: Positive for weakness. Negative for dizziness, tremors, speech change, focal weakness, seizures and headaches.  Endo/Heme/Allergies: Negative.  Does not bruise/bleed easily.  Psychiatric/Behavioral: Negative.  Negative for depression, hallucinations and suicidal ideas.     PHYSICAL EXAMINATION:  GENERAL:  62 y.o.-year-old patient lying in the bed with no acute distress.  NECK:  Supple, no jugular venous distention. No thyroid enlargement, no tenderness.  LUNGS: Normal breath sounds bilaterally, no wheezing, rales,rhonchi  No use of  accessory muscles of respiration.  CARDIOVASCULAR: S1, S2 normal. No murmurs, rubs, or gallops.  ABDOMEN: Soft, non-tender, non-distended. Bowel sounds present. No organomegaly or mass.  EXTREMITIES: No pedal edema, cyanosis, or clubbing.  PSYCHIATRIC: The patient is alert and oriented x 3.  SKIN: No obvious rash, lesion, or ulcer.   DATA REVIEW:   CBC  Recent Labs Lab 03/06/17 0510  WBC 13.8*  HGB 11.2*  HCT 32.5*  PLT 122*    Chemistries   Recent Labs Lab 03/07/17 0337  NA 134*  K 3.8  CL 101  CO2 22  GLUCOSE 70  BUN 13  CREATININE 0.85  CALCIUM 11.0*  AST 123*  ALT 64*  ALKPHOS 369*  BILITOT 4.6*    Cardiac Enzymes No results for input(s): TROPONINI in the last 168 hours.  Microbiology Results  @[email or web address] @  RADIOLOGY:  Dg Chest 1 View  Result Date: 03/05/2017 CLINICAL DATA:  Dizziness, weakness EXAM: CHEST 1 VIEW COMPARISON:  02/01/2017 FINDINGS: Mild linear scarring in the right mid lung. Lungs are otherwise clear. No pleural effusion or pneumothorax. The heart is normal in size. Right IJ chest port terminates at the cavoatrial junction. IMPRESSION: No evidence of acute cardiopulmonary disease. Electronically Signed   By: Julian Hy M.D.   On: 03/05/2017 18:09   Ct Head Wo Contrast  Result Date: 03/05/2017  CLINICAL DATA:  Dizziness and weakness over the past 2 weeks. Stage 4 large cell neuroendocrine carcinoma. Recent administration of intravenous contrast for abdominal CT. EXAM: CT HEAD WITHOUT CONTRAST TECHNIQUE: Contiguous axial images were obtained from the base of the skull through the vertex without intravenous contrast. COMPARISON:  02/01/2017 FINDINGS: Brain: No evidence for acute hemorrhage, mass lesion, midline shift, hydrocephalus or large infarct. Vascular: Vascular structures are slightly bright and probably related to recent contrast administration. Skull: Normal. Negative for fracture or focal lesion. Sinuses/Orbits: No acute finding.  Other: None. IMPRESSION: No acute intracranial abnormality. Electronically Signed   By: Markus Daft M.D.   On: 03/05/2017 17:47   Ct Abdomen Pelvis W Contrast  Result Date: 03/05/2017 CLINICAL DATA:  Metastatic large cell neuroendocrine carcinoma. Severe right mid abdominal pain. EXAM: CT ABDOMEN AND PELVIS WITH CONTRAST TECHNIQUE: Multidetector CT imaging of the abdomen and pelvis was performed using the standard protocol following bolus administration of intravenous contrast. CONTRAST:  153mL ISOVUE-300 IOPAMIDOL (ISOVUE-300) INJECTION 61% COMPARISON:  PET-CT 11/25/2016.  Abdomen and pelvis CT 11/07/2016 FINDINGS: Lower chest: Lingular nodule measures 1.3 cm on today's exam not substantially changed since PET-CT when it was measured at 1.5 cm right middle lobe irregular nodule has been incompletely visualized. Hepatobiliary: Innumerable metastatic lesions are identified in the liver. This is difficult to directly compare to the prior PET-CT performed without intravenous contrast material. Imaging features appear relatively stable when comparing to 11/07/2016 exam. A discrete lesion in the lateral segment left liver along the falciform ligament is 1.9 cm in diameter today which compares to 1.9 cm when I remeasure the same lesion on the 11/07/2016 exam. Gallbladder is nondistended with a suggestion of gallbladder wall thickening. No intrahepatic or extrahepatic biliary dilation. Pancreas: Pancreatic parenchymal calcifications suggest chronic pancreatitis without peripancreatic edema or inflammation on today's study. No dilatation of the main pancreatic duct. Spleen: Unremarkable Adrenals/Urinary Tract: No adrenal nodule or mass. Kidneys are unremarkable. No evidence for hydroureter. The urinary bladder appears normal for the degree of distention. Stomach/Bowel: Stomach is nondistended. No gastric wall thickening. No evidence of outlet obstruction. Duodenum is normally positioned as is the ligament of Treitz.  Multiple distal duodenal diverticuli are evident. No evidence of small-bowel obstruction. The terminal ileum is normal. The appendix is normal. Oral contrast material has migrated through to the mid colon. Vascular/Lymphatic: There is abdominal aortic atherosclerosis without aneurysm. Portal vein and superior mesenteric vein are patent. Splenic vein is patent. Mass-effect noted on the intrahepatic hepatic veins due to metastatic disease. The celiac axis and SMA opacified. Similar appearance mild hepatoduodenal ligament lymphadenopathy. Small left para-aortic lymph nodes are similar to prior. No pelvic sidewall lymphadenopathy. Reproductive: The uterus has normal CT imaging appearance. There is no adnexal mass. Other: Small volume free fluid anterior to the liver is new in the interval. Trace free fluid is noted in the cul-de-sac. Musculoskeletal: Diffuse sclerotic bone lesions have clearly progressed since the PET-CT from 11/25/2016. IMPRESSION: 1. Interval development of a small amount of fluid anterior to the liver with trace free fluid noted in the cul-de-sac. 2. Innumerable hepatic metastases, similar in appearance to prior imaging studies. Small lymph nodes in the hepatoduodenal ligament are stable. 3. Interval progression of innumerable bony metastases since 11/25/2016. 4. No evidence for bowel obstruction. No intraperitoneal free air. No bowel wall thickening or inflammatory changes in the mesenteric. 5. Nondistended gallbladder with question of mild gallbladder wall thickening. Ultrasound may prove helpful to further assess. If there is clinical concern for cholecystitis,  nuclear scintigraphy may prove helpful to further evaluate. 6. Incomplete visualization right middle lobe nodule with similar appearance of the lingular nodule. Electronically Signed   By: Misty Stanley M.D.   On: 03/05/2017 16:59      Current Discharge Medication List    START taking these medications   Details  feeding  supplement, ENSURE ENLIVE, (ENSURE ENLIVE) LIQD Take 237 mLs by mouth 2 (two) times daily between meals. Qty: 237 mL, Refills: 12    LORazepam (ATIVAN) 2 MG/ML concentrated solution Take 0.1-0.3 mLs (0.2-0.6 mg total) by mouth every 6 (six) hours as needed for anxiety or sleep. Qty: 30 mL, Refills: 0      CONTINUE these medications which have NOT CHANGED   Details  lidocaine-prilocaine (EMLA) cream Apply to affected area once Qty: 30 g, Refills: 3   Associated Diagnoses: Large cell neuroendocrine carcinoma (HCC)    lisinopril (PRINIVIL,ZESTRIL) 10 MG tablet Take 10 mg by mouth daily.    metoprolol tartrate (LOPRESSOR) 50 MG tablet Take 1 tablet (50 mg total) by mouth 2 (two) times daily. Qty: 60 tablet, Refills: 3    omeprazole (PRILOSEC) 20 MG capsule Take 40 mg by mouth daily.   Associated Diagnoses: Large cell neuroendocrine carcinoma (HCC)    ondansetron (ZOFRAN) 8 MG tablet Take 1 tablet (8 mg total) by mouth 2 (two) times daily as needed. Start on the third day after cisplatin chemotherapy. Qty: 60 tablet, Refills: 2   Associated Diagnoses: Large cell neuroendocrine carcinoma ([place])      STOP taking these medications     ibuprofen (ADVIL,MOTRIN) 200 MG tablet      prochlorperazine (COMPAZINE) 10 MG tablet             Management plans discussed with the patient and she is in agreement. Stable for discharge home with hospice  Patient should follow up with hospice  CODE STATUS:     Code Status Orders        Start     Ordered   03/06/17 1107  Do not attempt resuscitation (DNR)  Continuous    Question Answer Comment  In the event of cardiac or respiratory ARREST Do not call a "code blue"   In the event of cardiac or respiratory ARREST Do not perform Intubation, CPR, defibrillation or ACLS   In the event of cardiac or respiratory ARREST Use medication by any route, position, wound care, and other measures to relive pain and suffering. May use oxygen, suction and  manual treatment of airway obstruction as needed for comfort.      03/06/17 1106    Code Status History    Date Active Date Inactive Code Status Order ID Comments User Context   03/05/2017  9:20 PM 03/06/2017 11:06 AM Partial Code [id number]  Henreitta Leber, MD ED   02/01/2017  3:09 PM 02/02/2017  4:13 PM Full Code [id number]  Baxter Hire, MD Inpatient   04/22/2014 11:21 AM 04/22/2014  9:22 PM Full Code [id number]  Evans Lance, MD Inpatient    Advance Directive Documentation     Most Recent Value  Type of Advance Directive  Healthcare Power of [place], Living will  Pre-existing out of facility DNR order (yellow form or pink MOST form)  -  "MOST" Form in Place?  -      TOTAL TIME TAKING CARE OF THIS PATIENT: 37 minutes.    Note: This dictation was prepared with Dragon dictation along with smaller phrase technology. Any transcriptional errors that result  from this process are unintentional.  Kinta Martis M.D on 03/07/2017 at 9:01 AM  Between 7am to 6pm - Pager - [phone] After 6pm go to [email or web address] - password [email or web address] [facility]  Office  [phone]  CC: Primary care physician; Barbaraann Boys, MD

## 2017-03-07 NOTE — Progress Notes (Signed)
Patient discharged home with hospice. Prescriptions given to patient. All discharge instructions given and all questions answered.

## 2017-03-07 NOTE — Telephone Encounter (Signed)
Hospice informed  

## 2017-03-07 NOTE — Consult Note (Signed)
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  INTERVAL HISTORY: Patient is a 61 year old female who last received chemotherapy with cisplatin and etoposide on February 03, 2017. Patient states she was doing well until about one week ago when she became increasingly weak and fatigue, poor appetite, with increased nausea. She also has a declining performance status. Subsequent laboratory work revealed hyperbilirubinemia as well as hypercalcemia. CT scan also revealed progressive disease. She feels improved since admission, but still has a significantly decreased performance status. Her husband reports she is in bed most of the day. Previously, her daughter was reporting confusion. She has no neurologic complaints. She denies any fevers. She has a poor appetite. She denies any chest pain or shortness of breath. She has persistent nausea, but denies any vomiting, constipation, or diarrhea. She has no urinary complaints. Patient offers no further specific complaints.  REVIEW OF SYSTEMS:   Review of Systems  Constitutional: Positive for malaise/fatigue. Negative for fever and weight loss.  Respiratory: Negative.  Negative for cough and shortness of breath.   Cardiovascular: Negative.  Negative for chest pain and leg swelling.  Gastrointestinal: Positive for abdominal pain and nausea. Negative for constipation, diarrhea and vomiting.  Genitourinary: Negative.   Musculoskeletal: Negative.   Skin: Negative.  Negative for rash.  Neurological: Positive for weakness. Negative for sensory change and focal weakness.  Psychiatric/Behavioral: The patient is not  nervous/anxious.     As per HPI. Otherwise, a complete review of systems is negative.  PAST MEDICAL HISTORY: Past Medical History:  Diagnosis Date  . Cancer ([place])    lung cancer  . Coronary artery disease    Cardiac catheterization in January of 2015 showed mild nonobstructive three-vessel coronary artery disease with significant mid LAD spasm which improved with nitroglycerin. Metoprolol was switched to diltiazem  . Hyperlipidemia   . Hypertension   . PSVT (paroxysmal supraventricular tachycardia) ([place])     PAST SURGICAL HISTORY: Past Surgical History:  Procedure Laterality Date  . CARDIAC CATHETERIZATION  06/2013   [facility]  . PORTA CATH INSERTION N/A 11/27/2016   Procedure: Glori Luis Cath Insertion;  Surgeon: Katha Cabal, MD;  Location: [facility];  Service: Cardiovascular;  Laterality: N/A;  . SUPRAVENTRICULAR TACHYCARDIA ABLATION N/A 04/22/2014   Procedure: SUPRAVENTRICULAR TACHYCARDIA ABLATION;  Surgeon: Evans Lance, MD;  Location: [facility] CATH LAB;  Service: Cardiovascular;  Laterality: N/A;  . TONSILLECTOMY    . TUBAL LIGATION      FAMILY HISTORY: Family History  Problem Relation Age of Onset  . Stroke Mother   . Hypertension Mother   . Heart attack Father   . Thyroid cancer Sister   . Stroke Sister     ADVANCED DIRECTIVES (Y/N):  @ADVDIR @  HEALTH MAINTENANCE: Social History  Substance Use Topics  . Smoking status: Current Some Day Smoker    Years: 30.00    Types: Cigarettes  . Smokeless tobacco: Never Used  . Alcohol use No     Colonoscopy:  PAP:  Bone density:  Lipid panel:  Allergies  Allergen Reactions  . Bee Venom Swelling  . Codeine Nausea And Vomiting    Pruritic rash  . Sulfa Antibiotics Other (See Comments)    Other reaction(s): Pruritic rash  .  Penicillin G Rash    Has patient had a PCN reaction causing immediate rash, facial/tongue/throat swelling, SOB or lightheadedness with hypotension: Yes Has patient had a PCN reaction  causing severe rash involving mucus membranes or skin necrosis: No Has patient had a PCN reaction that required hospitalization No Has patient had a PCN reaction occurring within the last 10 years: No If all of the above answers are "NO", then may proceed with Cephalosporin use.   . Shellfish Allergy Rash    Current Facility-Administered Medications  Medication Dose Route Frequency Provider Last Rate Last Dose  . 0.9 %  sodium chloride infusion   Intravenous Continuous Demetrios Loll, MD 125 mL/hr at 03/07/17 0741    . acetaminophen (TYLENOL) tablet 650 mg  650 mg Oral Q6H PRN Demetrios Loll, MD   650 mg at 03/07/17 0033   Or  . acetaminophen (TYLENOL) suppository 650 mg  650 mg Rectal Q6H PRN Demetrios Loll, MD      . albuterol (PROVENTIL) (2.5 MG/3ML) 0.083% nebulizer solution 2.5 mg  2.5 mg Nebulization Q2H PRN Demetrios Loll, MD      . bisacodyl (DULCOLAX) EC tablet 5 mg  5 mg Oral Daily PRN Demetrios Loll, MD      . docusate sodium (COLACE) capsule 100 mg  100 mg Oral BID Demetrios Loll, MD   100 mg at 03/06/17 1112  . enoxaparin (LOVENOX) injection 40 mg  40 mg Subcutaneous Q24H Demetrios Loll, MD   40 mg at 03/06/17 2127  . feeding supplement (ENSURE ENLIVE) (ENSURE ENLIVE) liquid 237 mL  237 mL Oral BID BM Mody, Sital, MD      . lisinopril (PRINIVIL,ZESTRIL) tablet 10 mg  10 mg Oral Daily Demetrios Loll, MD   10 mg at 03/06/17 1111  . LORazepam (ATIVAN) 2 MG/ML concentrated solution 0.26-0.5 mg  0.26-0.5 mg Oral Q6H PRN Dellinger, Marianne L, PA-C   0.5 mg at 03/07/17 0338  . metoprolol tartrate (LOPRESSOR) tablet 50 mg  50 mg Oral BID Demetrios Loll, MD   50 mg at 03/06/17 2127  . ondansetron (ZOFRAN) tablet 4 mg  4 mg Oral Q6H PRN Demetrios Loll, MD       Or  . ondansetron [facility]) injection 4 mg  4 mg Intravenous Q6H PRN Demetrios Loll, MD      . pantoprazole (PROTONIX) EC tablet 40 mg  40 mg Oral Daily Demetrios Loll, MD   40 mg at 03/06/17 1111  . prochlorperazine (COMPAZINE) tablet 10 mg  10 mg Oral Q6H PRN Demetrios Loll, MD       . senna-docusate (Senokot-S) tablet 1 tablet  1 tablet Oral QHS PRN Demetrios Loll, MD      . sodium chloride flush (NS) 0.9 % injection 10-40 mL  10-40 mL Intracatheter PRN Bettey Costa, MD      . zolpidem (AMBIEN) tablet 5 mg  5 mg Oral QHS PRN Demetrios Loll, MD        OBJECTIVE: Vitals:   03/06/17 2006 03/07/17 0434  BP: (!) 152/91 (!) 141/89  Pulse: (!) 117 97  Resp: 16 15  Temp: 98.5 F (36.9 C) 97.9 F (36.6 C)  SpO2: 96% 94%     Body mass index is 23.81 kg/m.    ECOG FS:4 - Bedbound  General: Ill-appearing, no acute distress. Eyes: Pink conjunctiva, anicteric sclera. HEENT: Normocephalic, moist mucous membranes, clear oropharnyx. Lungs: Clear to auscultation bilaterally. Heart: Regular rate and rhythm. No rubs, murmurs, or gallops. Abdomen: Soft, nontender, nondistended. No organomegaly noted,  normoactive bowel sounds. Musculoskeletal: No edema, cyanosis, or clubbing. Neuro: Alert, answering all questions appropriately. Cranial nerves grossly intact. Skin: Mild jaundice. No rashes or petechiae noted. Psych: Normal affect.   LAB RESULTS:  Lab Results  Component Value Date   NA 134 (L) 03/07/2017   K 3.8 03/07/2017   CL 101 03/07/2017   CO2 22 03/07/2017   GLUCOSE 70 03/07/2017   BUN 13 03/07/2017   CREATININE 0.85 03/07/2017   CALCIUM 11.0 (H) 03/07/2017   PROT 5.6 (L) 03/07/2017   ALBUMIN 2.7 (L) 03/07/2017   AST 123 (H) 03/07/2017   ALT 64 (H) 03/07/2017   ALKPHOS 369 (H) 03/07/2017   BILITOT 4.6 (H) 03/07/2017   GFRNONAA >60 03/07/2017   GFRAA >60 03/07/2017    Lab Results  Component Value Date   WBC 13.8 (H) 03/06/2017   NEUTROABS 10.1 (H) 03/05/2017   HGB 11.2 (L) 03/06/2017   HCT 32.5 (L) 03/06/2017   MCV 102.2 (H) 03/06/2017   PLT 122 (L) 03/06/2017     STUDIES: Dg Chest 1 View  Result Date: 03/05/2017 CLINICAL DATA:  Dizziness, weakness EXAM: CHEST 1 VIEW COMPARISON:  02/01/2017 FINDINGS: Mild linear scarring in the right mid lung. Lungs are  otherwise clear. No pleural effusion or pneumothorax. The heart is normal in size. Right IJ chest port terminates at the cavoatrial junction. IMPRESSION: No evidence of acute cardiopulmonary disease. Electronically Signed   By: Julian Hy M.D.   On: 03/05/2017 18:09   Ct Head Wo Contrast  Result Date: 03/05/2017 CLINICAL DATA:  Dizziness and weakness over the past 2 weeks. Stage 4 large cell neuroendocrine carcinoma. Recent administration of intravenous contrast for abdominal CT. EXAM: CT HEAD WITHOUT CONTRAST TECHNIQUE: Contiguous axial images were obtained from the base of the skull through the vertex without intravenous contrast. COMPARISON:  02/01/2017 FINDINGS: Brain: No evidence for acute hemorrhage, mass lesion, midline shift, hydrocephalus or large infarct. Vascular: Vascular structures are slightly bright and probably related to recent contrast administration. Skull: Normal. Negative for fracture or focal lesion. Sinuses/Orbits: No acute finding. Other: None. IMPRESSION: No acute intracranial abnormality. Electronically Signed   By: Markus Daft M.D.   On: 03/05/2017 17:47   Ct Abdomen Pelvis W Contrast  Result Date: 03/05/2017 CLINICAL DATA:  Metastatic large cell neuroendocrine carcinoma. Severe right mid abdominal pain. EXAM: CT ABDOMEN AND PELVIS WITH CONTRAST TECHNIQUE: Multidetector CT imaging of the abdomen and pelvis was performed using the standard protocol following bolus administration of intravenous contrast. CONTRAST:  147mL ISOVUE-300 IOPAMIDOL (ISOVUE-300) INJECTION 61% COMPARISON:  PET-CT 11/25/2016.  Abdomen and pelvis CT 11/07/2016 FINDINGS: Lower chest: Lingular nodule measures 1.3 cm on today's exam not substantially changed since PET-CT when it was measured at 1.5 cm right middle lobe irregular nodule has been incompletely visualized. Hepatobiliary: Innumerable metastatic lesions are identified in the liver. This is difficult to directly compare to the prior PET-CT  performed without intravenous contrast material. Imaging features appear relatively stable when comparing to 11/07/2016 exam. A discrete lesion in the lateral segment left liver along the falciform ligament is 1.9 cm in diameter today which compares to 1.9 cm when I remeasure the same lesion on the 11/07/2016 exam. Gallbladder is nondistended with a suggestion of gallbladder wall thickening. No intrahepatic or extrahepatic biliary dilation. Pancreas: Pancreatic parenchymal calcifications suggest chronic pancreatitis without peripancreatic edema or inflammation on today's study. No dilatation of the main pancreatic duct. Spleen: Unremarkable Adrenals/Urinary Tract: No adrenal nodule or mass. Kidneys are unremarkable. No evidence for  hydroureter. The urinary bladder appears normal for the degree of distention. Stomach/Bowel: Stomach is nondistended. No gastric wall thickening. No evidence of outlet obstruction. Duodenum is normally positioned as is the ligament of Treitz. Multiple distal duodenal diverticuli are evident. No evidence of small-bowel obstruction. The terminal ileum is normal. The appendix is normal. Oral contrast material has migrated through to the mid colon. Vascular/Lymphatic: There is abdominal aortic atherosclerosis without aneurysm. Portal vein and superior mesenteric vein are patent. Splenic vein is patent. Mass-effect noted on the intrahepatic hepatic veins due to metastatic disease. The celiac axis and SMA opacified. Similar appearance mild hepatoduodenal ligament lymphadenopathy. Small left para-aortic lymph nodes are similar to prior. No pelvic sidewall lymphadenopathy. Reproductive: The uterus has normal CT imaging appearance. There is no adnexal mass. Other: Small volume free fluid anterior to the liver is new in the interval. Trace free fluid is noted in the cul-de-sac. Musculoskeletal: Diffuse sclerotic bone lesions have clearly progressed since the PET-CT from 11/25/2016. IMPRESSION: 1.  Interval development of a small amount of fluid anterior to the liver with trace free fluid noted in the cul-de-sac. 2. Innumerable hepatic metastases, similar in appearance to prior imaging studies. Small lymph nodes in the hepatoduodenal ligament are stable. 3. Interval progression of innumerable bony metastases since 11/25/2016. 4. No evidence for bowel obstruction. No intraperitoneal free air. No bowel wall thickening or inflammatory changes in the mesenteric. 5. Nondistended gallbladder with question of mild gallbladder wall thickening. Ultrasound may prove helpful to further assess. If there is clinical concern for cholecystitis, nuclear scintigraphy may prove helpful to further evaluate. 6. Incomplete visualization right middle lobe nodule with similar appearance of the lingular nodule. Electronically Signed   By: Misty Stanley M.D.   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   PLAN:    1. 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 Despite this, recent CT scan revealed progressive disease. Given patient's declining performance status and worsening liver function, no further treatments are planned. Discussed at length hospice and end-of-life care today. Patient has agreed and will go home with hospice in the next 1-2 days. 2. Hypercalcemia: Improved. Patient received IV Zometa yesterday. No further intervention is needed. 3. Hyperbilirubinemia: Likely secondary to progressive malignancy. Hospice as above.  Will follow.  Lloyd Huger, MD   03/07/2017 8:48 AM

## 2017-03-07 NOTE — Care Management Note (Signed)
Case Management Note  Patient Details  Name: Latasha [name] MRN: [id number] Date of Birth: 26-Jan-1956  Subjective/Objective: Discharging today                   Action/Plan: Patient has wheelchair, bsc and walker. No DME needs. Hospice services have been arranged with [facility]. Spouse with transfer patient patient by car.    Expected Discharge Date:  03/07/17               Expected Discharge Plan:  Home w Hospice Care  In-House Referral:     Discharge planning Services  CM Consult  Post Acute Care Choice:  Hospice Choice offered to:  Adult Children  DME Arranged:    DME Agency:     HH Arranged:  Disease Management [place] Agency:  [facility]  Status of Service:  Completed, signed off  If discussed at [place] of Stay Meetings, dates discussed:    Additional Comments:  Jolly Mango, RN 03/07/2017, 10:12 AM

## 2017-03-07 NOTE — Telephone Encounter (Signed)
Patient discharging from hospital and is referred to Hospice. Asking for verbal order to open to services and Dr Grayland Ormond be her doctor.. Please advise

## 2017-03-07 NOTE — Progress Notes (Signed)
Visit made to new referral for [facility] services at home. Patient seen sitting up on the side of the bed, breakfast tray present. Husband at bedside assisting patient with her coffee. Writer initiated education regarding hospice services, philosophy and team approach to care with understanding voiced. Patient was alert and involved in the discussion. She did have some difficulty expressing/finding words at times. She wants to go home. Reports she has a walker, BSC and wheelchair at home, denies any need for other DME. Hospice information and contact number given to patient. Signed DNR in place in patient's chart and will accompany patient home. Updated patient information faxed to referral. Thank you. Flo Shanks RN, BSN, [facility] [place] [facility], hospital Liaison [phone]

## 2017-03-07 NOTE — Telephone Encounter (Signed)
Yes.  Thank you.

## 2017-03-10 ENCOUNTER — Ambulatory Visit: Payer: 59

## 2017-03-10 ENCOUNTER — Encounter: Admission: RE | Admit: 2017-03-10 | Payer: 59 | Source: Ambulatory Visit

## 2017-03-12 ENCOUNTER — Inpatient Hospital Stay: Payer: 59 | Admitting: Oncology

## 2017-03-17 DEATH — deceased

## 2018-03-22 IMAGING — PT NM PET TUM IMG INITIAL (PI) SKULL BASE T - THIGH
1 of 9 series · 1 of 25 positions shown · non-contrast
Comparison: CT chest abdomen pelvis 11/07/2016.

CLINICAL DATA: Initial treatment strategy for large cell
neuroendocrine carcinoma.

EXAM:
NUCLEAR MEDICINE PET SKULL BASE TO THIGH
TECHNIQUE: 12.6 mCi F-18 FDG was injected intravenously. Full-ring PET imaging
was performed from the skull base to thigh after the radiotracer. CT
data was obtained and used for attenuation correction and anatomic
localization.
FASTING BLOOD GLUCOSE:  Value: 86 mg/dl

[Series 3: ct wb 5.0 b30f · axial · 5.0mm · 0.98mm/px · 1 of 290 slices shown]
[im 290/290  brain]
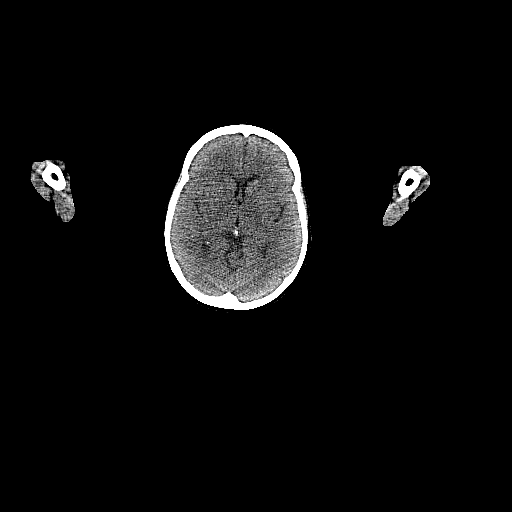

[1 of 25 positions shown; findings below may reference images not displayed]

FINDINGS: NECK

No hypermetabolic lymph nodes in the neck. CT images show no acute
findings.

CHEST

There are hypermetabolic low right internal jugular, bilateral
mediastinal and right hilar lymph nodes. Index right paratracheal
lymph node measures 1.5 cm with an SUV max of 11.9. Perihilar right
middle lobe spiculated mass measures approximately 3.2 cm with an
SUV max of 3.8. 1.5 cm lingular nodule is hypermetabolic as well.
Atherosclerotic calcification of the arterial vasculature, including
coronary arteries. No pericardial or pleural effusion.

ABDOMEN/PELVIS

Liver is completely replaced with ill-defined low-attenuation
lesions and is diffusely hypermetabolic with an index SUV max of
17.7. There are hypermetabolic periportal lymph nodes as well. No
abnormal hypermetabolism in the adrenal glands, spleen or pancreas.
No additional hypermetabolic lymph nodes. Liver is markedly
enlarged. Gallbladder, adrenal glands, kidneys and spleen are
grossly unremarkable. Calcifications are seen in the pancreas.
Stomach and small bowel are grossly unremarkable. Small pelvic free
fluid.

SKELETON

Multiple hypermetabolic lesions are seen throughout the visualized
osseous structures. Index hypermetabolic lesion in the central
sacrum has an SUV max of 6.6.
IMPRESSION: 1. Hypermetabolic right middle lobe mass and lingular nodule with
hypermetabolic low internal jugular/mediastinal/right hilar
adenopathy, diffuse hypermetabolic involvement of the liver,
periportal adenopathy and multiple hypermetabolic osseous lesions,
most consistent with stage IV primary bronchogenic carcinoma
(large-cell neuroendocrine carcinoma by biopsy).
2. Small pelvic free fluid.
3. Aortic atherosclerosis (768RI-170.0). Coronary artery
calcification.
4. Chronic calcific pancreatitis.

## 2018-06-30 IMAGING — CR DG CHEST 1V
1 series · 1 of 1 positions shown · non-contrast
Comparison: 02/01/2017

CLINICAL DATA: Dizziness, weakness

EXAM:
CHEST 1 VIEW

[dg chest 1 view]
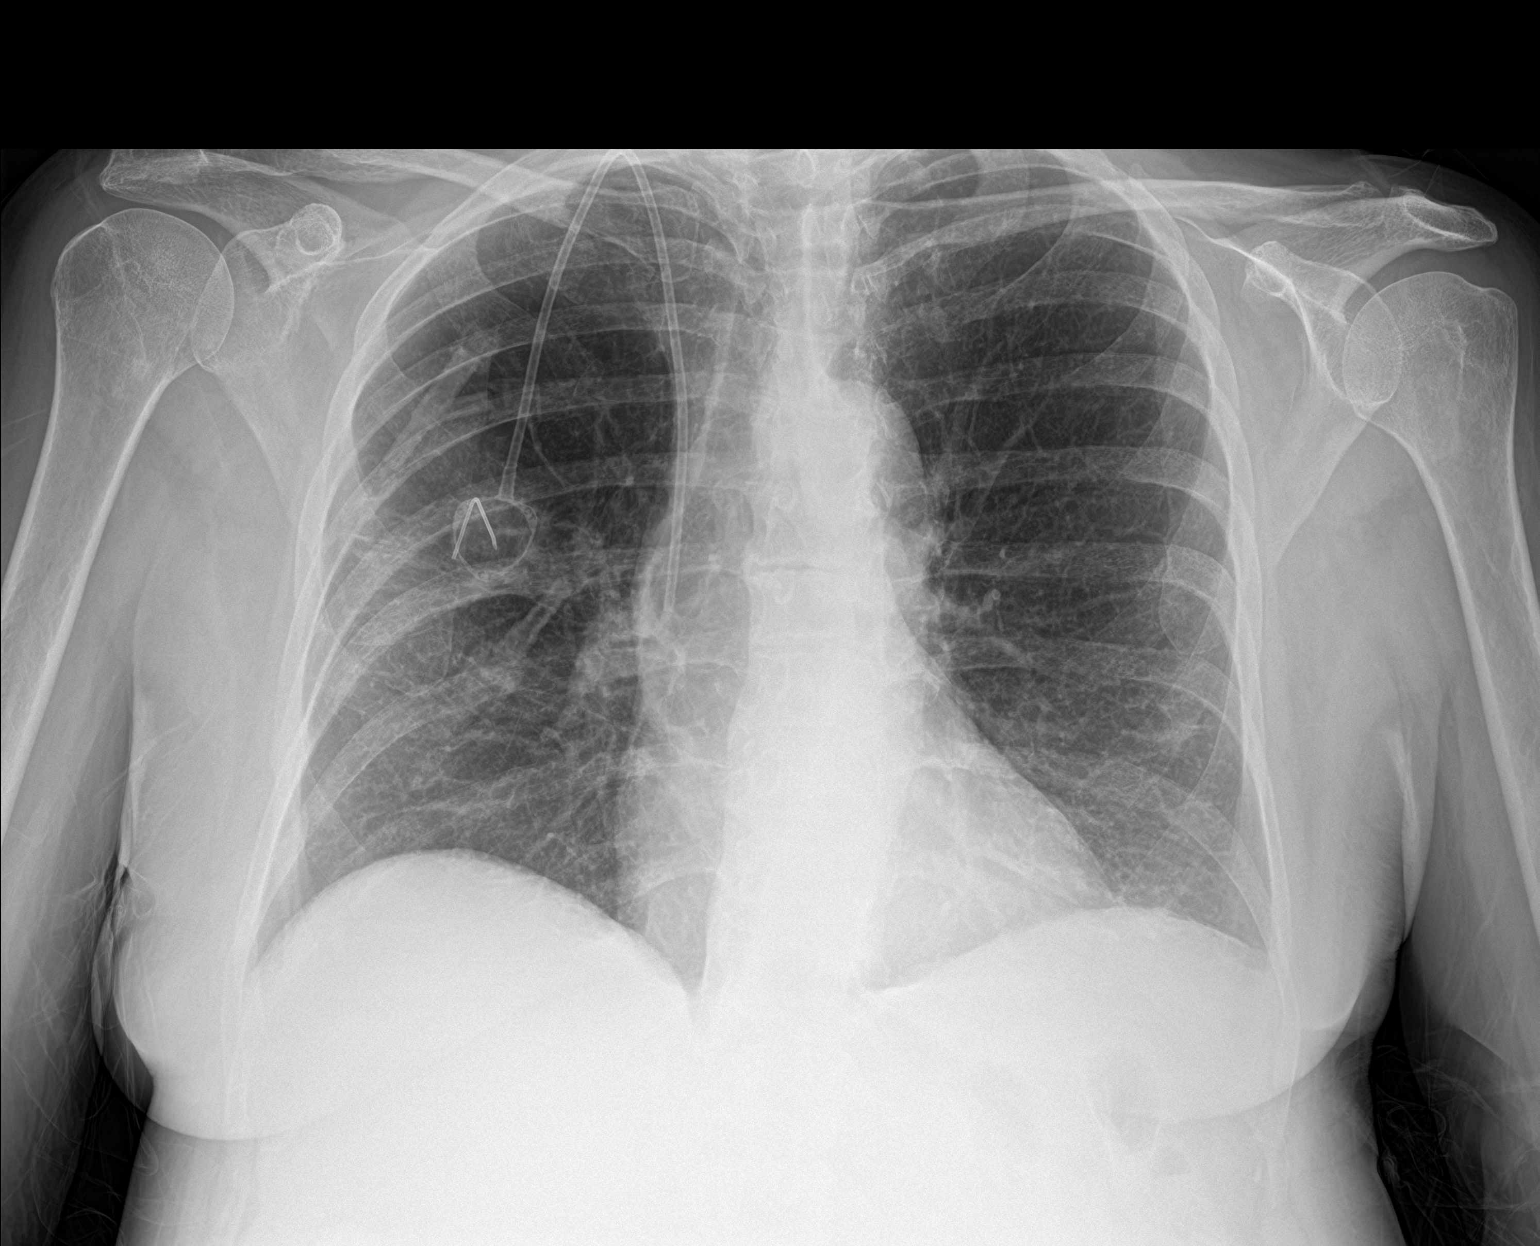

[1 of 1 positions shown; findings below may reference images not displayed]

FINDINGS: Mild linear scarring in the right mid lung. Lungs are otherwise
clear. No pleural effusion or pneumothorax.

The heart is normal in size.

Right IJ chest port terminates at the cavoatrial junction.
IMPRESSION: No evidence of acute cardiopulmonary disease.
